# Patient Record
Sex: Female | Born: 1965 | Race: Black or African American | Hispanic: No | Smoking: Current some day smoker
Health system: Southern US, Community
[De-identification: ages and names within clinical notes are randomized; demographics above are authoritative.]

## PROBLEM LIST (undated history)

## (undated) DIAGNOSIS — T7840XA Allergy, unspecified, initial encounter: Secondary | ICD-10-CM

## (undated) DIAGNOSIS — D649 Anemia, unspecified: Secondary | ICD-10-CM

## (undated) DIAGNOSIS — B359 Dermatophytosis, unspecified: Secondary | ICD-10-CM

## (undated) DIAGNOSIS — A749 Chlamydial infection, unspecified: Secondary | ICD-10-CM

## (undated) DIAGNOSIS — L309 Dermatitis, unspecified: Secondary | ICD-10-CM

## (undated) DIAGNOSIS — B009 Herpesviral infection, unspecified: Secondary | ICD-10-CM

## (undated) HISTORY — PX: OTHER SURGICAL HISTORY: SHX169

## (undated) HISTORY — PX: BREAST REDUCTION SURGERY: SHX8

## (undated) HISTORY — PX: COLONOSCOPY: SHX174

## (undated) HISTORY — DX: Herpesviral infection, unspecified: B00.9

## (undated) HISTORY — DX: Dermatitis, unspecified: L30.9

## (undated) HISTORY — DX: Chlamydial infection, unspecified: A74.9

## (undated) HISTORY — PX: POLYPECTOMY: SHX149

## (undated) HISTORY — DX: Dermatophytosis, unspecified: B35.9

## (undated) HISTORY — DX: Allergy, unspecified, initial encounter: T78.40XA

## (undated) HISTORY — PX: WISDOM TOOTH EXTRACTION: SHX21

## (undated) HISTORY — DX: Anemia, unspecified: D64.9

## (undated) HISTORY — PX: BREAST SURGERY: SHX581

---

## 1999-02-08 ENCOUNTER — Other Ambulatory Visit: Admission: RE | Admit: 1999-02-08 | Discharge: 1999-02-08 | Payer: Self-pay | Admitting: Obstetrics and Gynecology

## 1999-02-11 ENCOUNTER — Ambulatory Visit (HOSPITAL_COMMUNITY): Admission: AD | Admit: 1999-02-11 | Discharge: 1999-02-11 | Payer: Self-pay | Admitting: Obstetrics and Gynecology

## 1999-07-16 ENCOUNTER — Other Ambulatory Visit: Admission: RE | Admit: 1999-07-16 | Discharge: 1999-07-16 | Payer: Self-pay | Admitting: Obstetrics and Gynecology

## 2000-02-09 ENCOUNTER — Inpatient Hospital Stay (HOSPITAL_COMMUNITY): Admission: AD | Admit: 2000-02-09 | Discharge: 2000-02-12 | Payer: Self-pay | Admitting: Obstetrics & Gynecology

## 2000-02-14 ENCOUNTER — Encounter: Admission: RE | Admit: 2000-02-14 | Discharge: 2000-03-18 | Payer: Self-pay | Admitting: Obstetrics and Gynecology

## 2000-02-16 ENCOUNTER — Inpatient Hospital Stay (HOSPITAL_COMMUNITY): Admission: AD | Admit: 2000-02-16 | Discharge: 2000-02-16 | Payer: Self-pay | Admitting: Obstetrics and Gynecology

## 2000-07-22 ENCOUNTER — Encounter: Admission: RE | Admit: 2000-07-22 | Discharge: 2000-08-20 | Payer: Self-pay | Admitting: Obstetrics and Gynecology

## 2001-04-27 ENCOUNTER — Encounter: Admission: RE | Admit: 2001-04-27 | Discharge: 2001-04-27 | Payer: Self-pay | Admitting: Obstetrics and Gynecology

## 2001-04-27 ENCOUNTER — Encounter: Payer: Self-pay | Admitting: Obstetrics and Gynecology

## 2001-05-04 ENCOUNTER — Encounter: Payer: Self-pay | Admitting: Emergency Medicine

## 2001-05-04 ENCOUNTER — Emergency Department (HOSPITAL_COMMUNITY): Admission: EM | Admit: 2001-05-04 | Discharge: 2001-05-04 | Payer: Self-pay | Admitting: Emergency Medicine

## 2001-12-30 ENCOUNTER — Other Ambulatory Visit: Admission: RE | Admit: 2001-12-30 | Discharge: 2001-12-30 | Payer: Self-pay | Admitting: Obstetrics and Gynecology

## 2002-05-17 ENCOUNTER — Ambulatory Visit (HOSPITAL_BASED_OUTPATIENT_CLINIC_OR_DEPARTMENT_OTHER): Admission: RE | Admit: 2002-05-17 | Discharge: 2002-05-18 | Payer: Self-pay | Admitting: Specialist

## 2002-05-17 ENCOUNTER — Encounter (INDEPENDENT_AMBULATORY_CARE_PROVIDER_SITE_OTHER): Payer: Self-pay | Admitting: Specialist

## 2003-01-05 ENCOUNTER — Other Ambulatory Visit: Admission: RE | Admit: 2003-01-05 | Discharge: 2003-01-05 | Payer: Self-pay | Admitting: Obstetrics and Gynecology

## 2004-01-17 ENCOUNTER — Other Ambulatory Visit: Admission: RE | Admit: 2004-01-17 | Discharge: 2004-01-17 | Payer: Self-pay | Admitting: Obstetrics and Gynecology

## 2004-03-06 ENCOUNTER — Ambulatory Visit (HOSPITAL_COMMUNITY): Admission: RE | Admit: 2004-03-06 | Discharge: 2004-03-06 | Payer: Self-pay | Admitting: Obstetrics and Gynecology

## 2004-03-15 ENCOUNTER — Ambulatory Visit (HOSPITAL_COMMUNITY): Admission: RE | Admit: 2004-03-15 | Discharge: 2004-03-15 | Payer: Self-pay | Admitting: Obstetrics and Gynecology

## 2004-06-20 ENCOUNTER — Inpatient Hospital Stay (HOSPITAL_COMMUNITY): Admission: AD | Admit: 2004-06-20 | Discharge: 2004-06-20 | Payer: Self-pay | Admitting: Obstetrics and Gynecology

## 2004-08-01 ENCOUNTER — Inpatient Hospital Stay (HOSPITAL_COMMUNITY): Admission: RE | Admit: 2004-08-01 | Discharge: 2004-08-04 | Payer: Self-pay | Admitting: Obstetrics and Gynecology

## 2004-08-05 ENCOUNTER — Encounter: Admission: RE | Admit: 2004-08-05 | Discharge: 2004-09-04 | Payer: Self-pay | Admitting: Obstetrics and Gynecology

## 2005-02-14 IMAGING — US US AMNIOCENTESIS
1 series · 2 of 2 positions shown · non-contrast
Comparison: none

[Series 1: unknown · 2 of 2 slices shown]
[im 1/2]
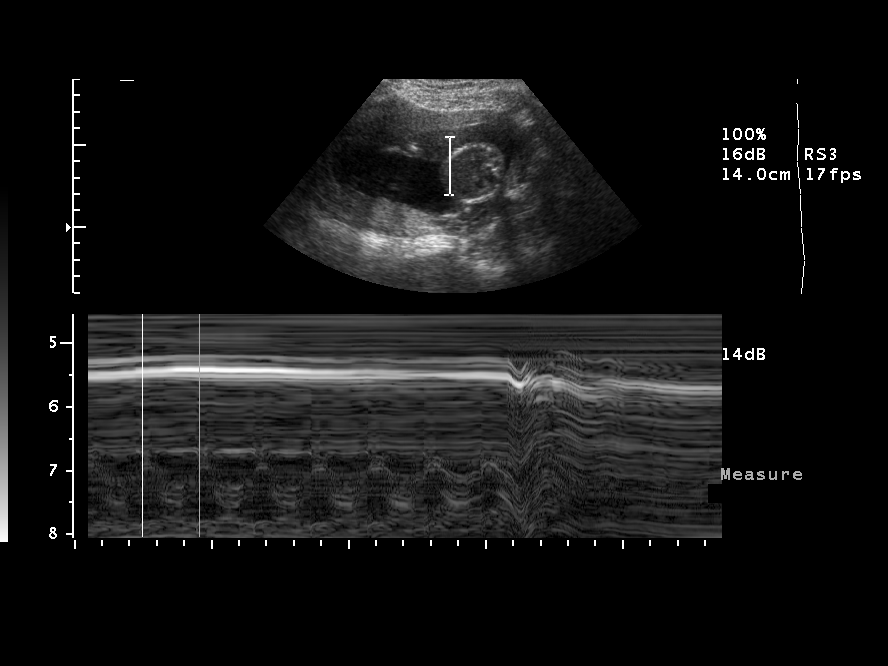
[im 2/2]
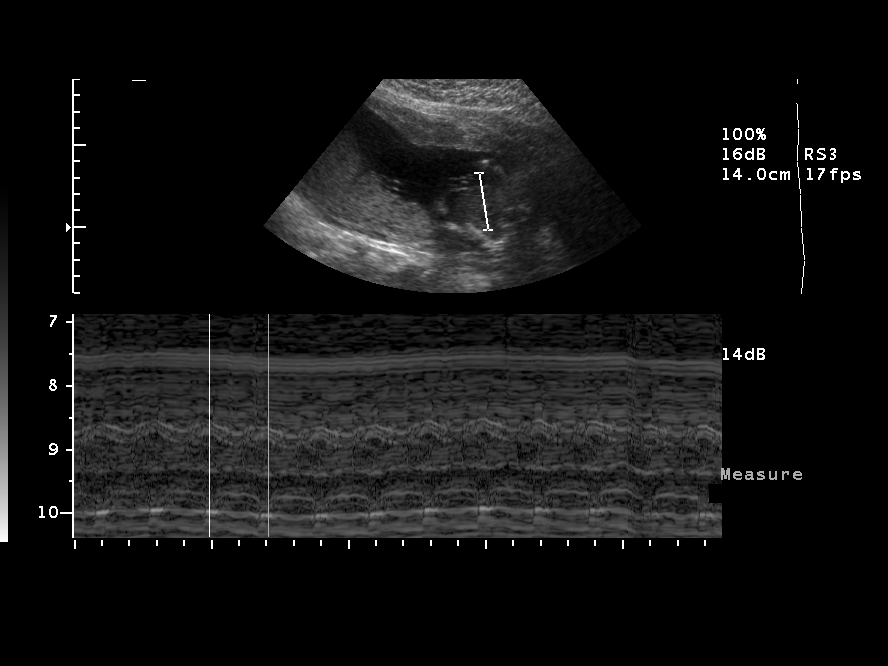

[2 of 2 positions shown; findings below may reference images not displayed]

ULTRASOUND AMNIOCENTESIS
 Ultrasound was utilized to perform amniocentesis by the requesting physician.

## 2005-02-23 IMAGING — US US OB DETAIL+14 WK
1 series · 13 of 28 positions shown · non-contrast
Comparison: none

CLINICAL DATA: Advanced maternal age.  Assess anatomy.

[Series 1: unknown · 0.27mm/px · 13 of 107 slices shown]
[im 4/107]
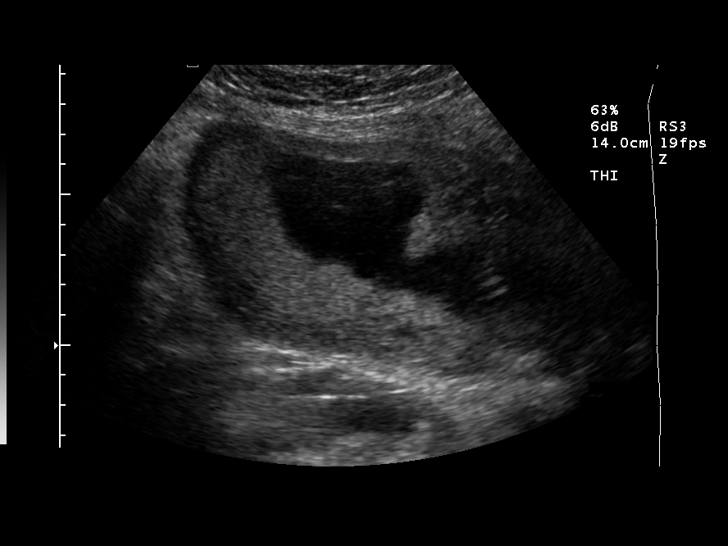
[im 12/107]
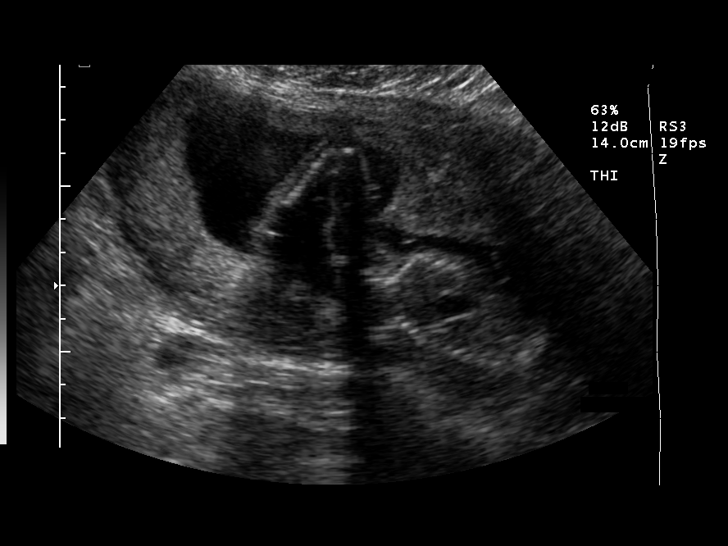
[im 20/107]
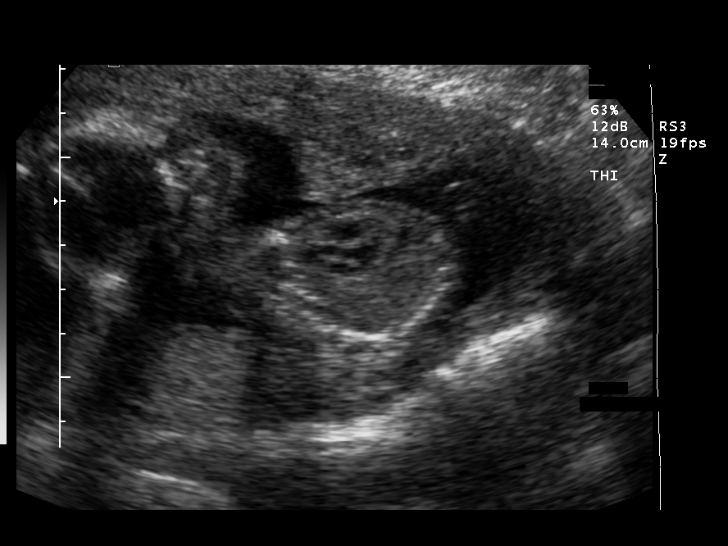
[im 28/107]
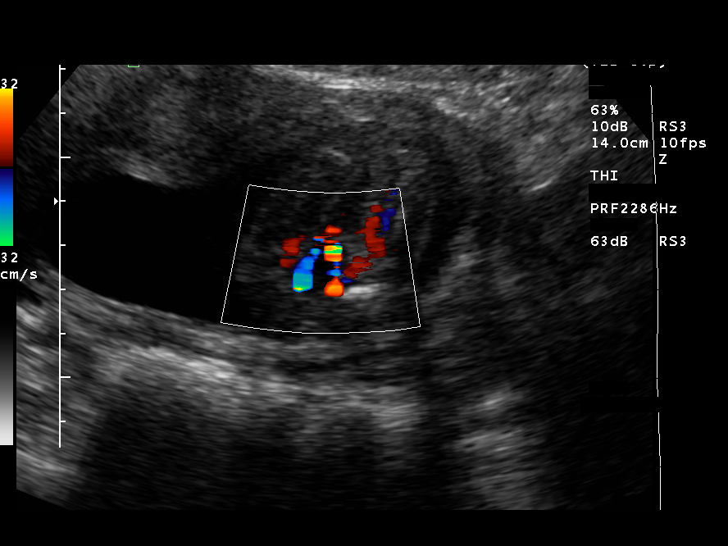
[im 36/107]
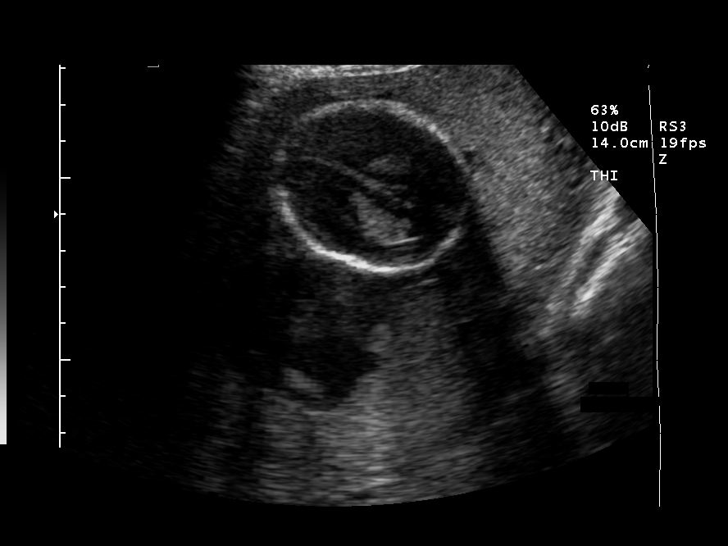
[im 44/107]
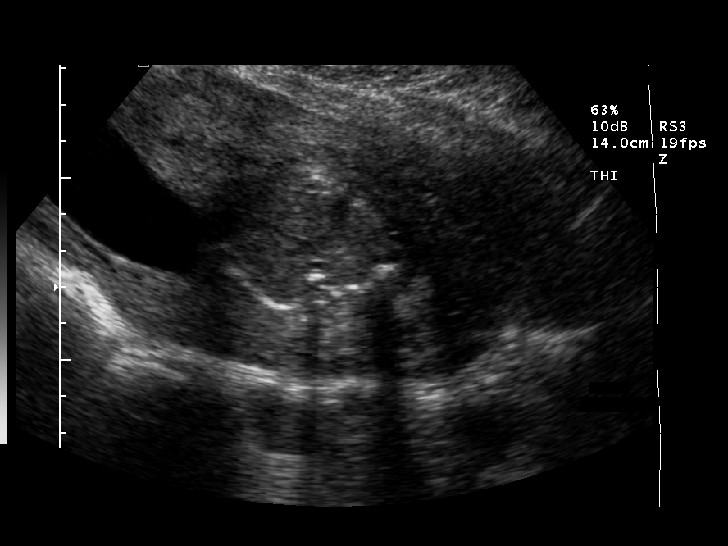
[im 55/107]
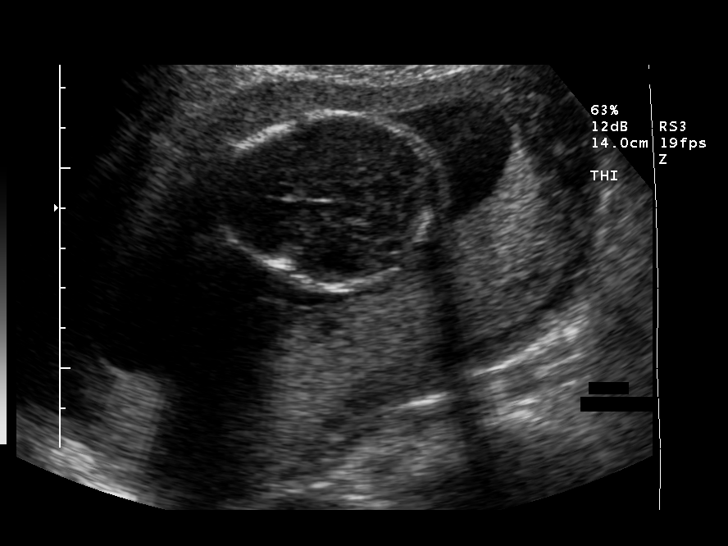
[im 63/107]
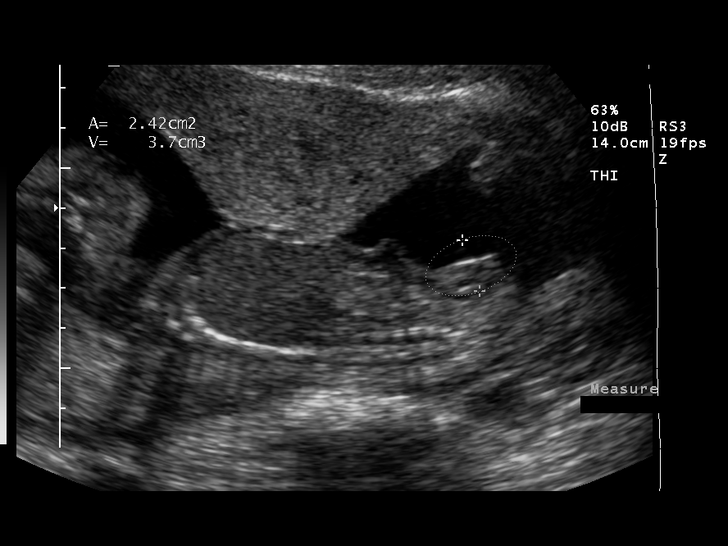
[im 71/107]
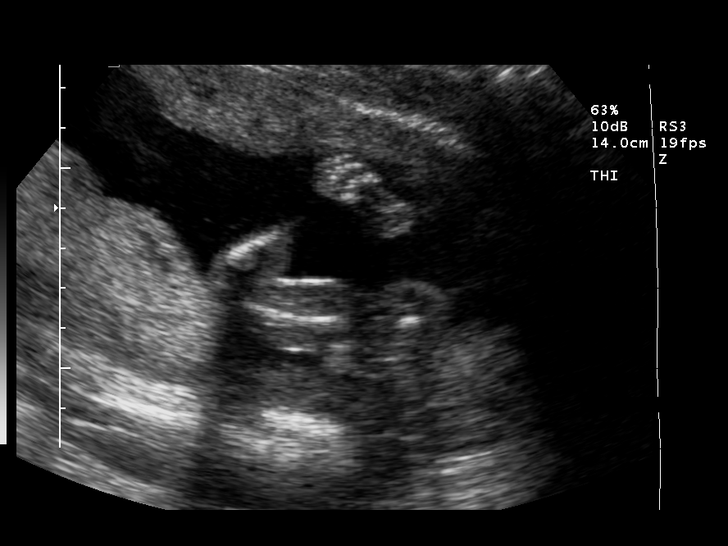
[im 79/107]
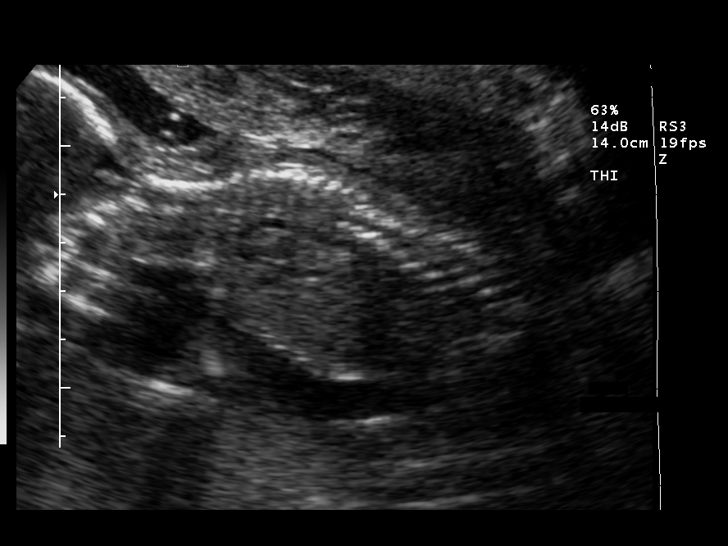
[im 87/107]
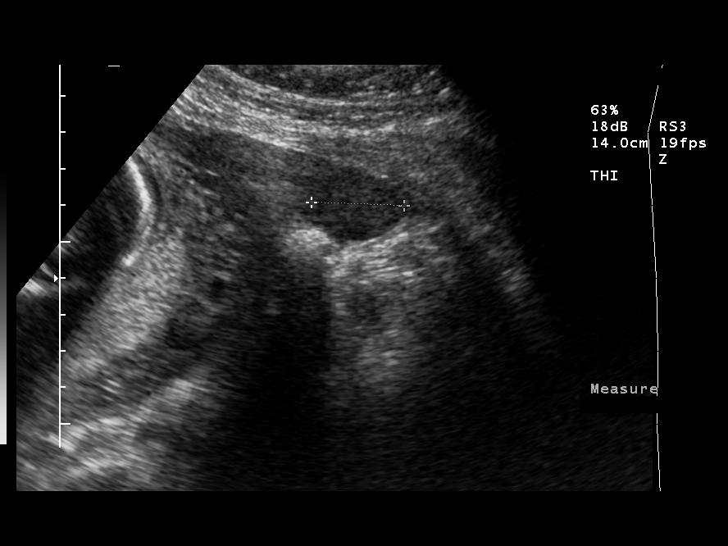
[im 95/107]
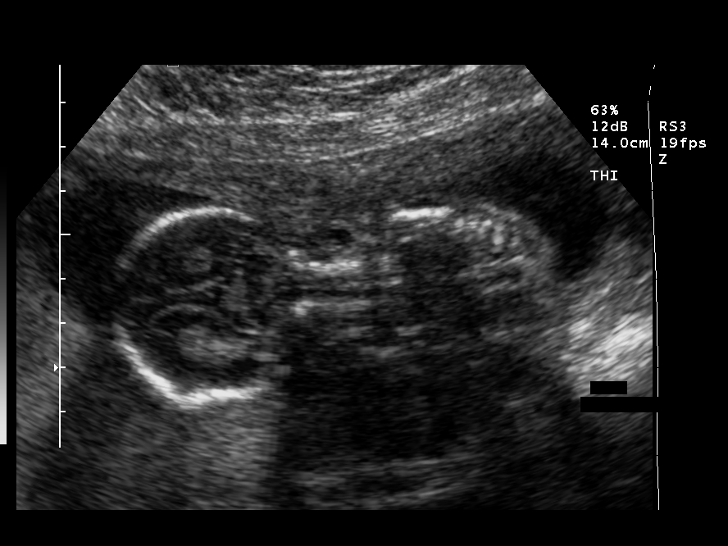
[im 103/107]
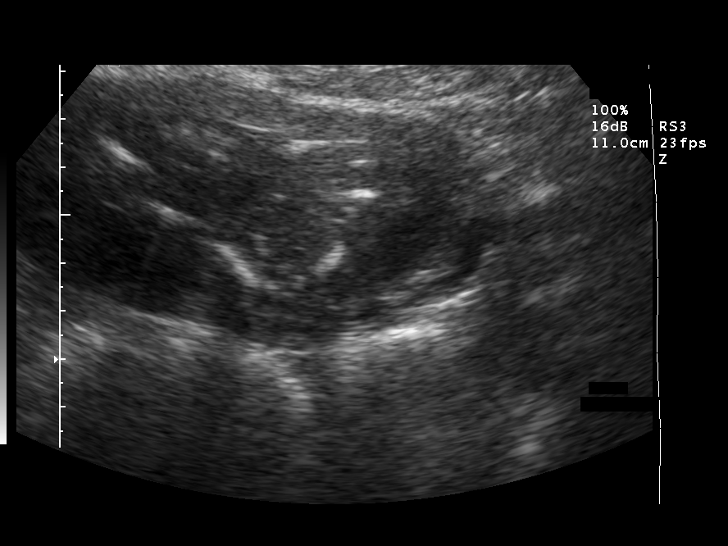

[13 of 28 positions shown; findings below may reference images not displayed]

DETAILED OBSTETRICAL ULTRASOUND 

 Number of Fetuses:  1
 Heart Rate:  150
 Movement:  Yes
 Breathing:  No
 Presentation:  Breech
 Placental Location:  Posterior
 Grade:  I
 Previa:  No
 Amniotic Fluid (Subjective):  Normal
 Amniotic Fluid (Objective):  4.8 cm Vertical pocket 

 FETAL BIOMETRY
 BPD:  4.3 cm   19 w 0 d
 HC:  16.1 cm   19 w 0 d
 AC:  13.1 cm   18 w 5 d
 FL:   2.9 cm   19 w 1 d
 HL:   2.8 cm   19 w 1 d

 MEAN GA:  19 w 0 d

 FETAL ANATOMY
 Lateral Ventricles:  Visualized 
 Thalami/CSP:  Visualized 
 Posterior Fossa:  Visualized 
 Nuchal Region:  Visualized 
 Spine:  Visualized 
 4 Chamber Heart on Left:  Visualized 
 Stomach on Left:  Visualized 
 3 Vessel Cord:  Visualized 
 Cord Insertion Site:  Visualized 
 Kidneys:  Visualized 
 Bladder:  Visualized 
 Extremities:  Visualized 

 ADDITIONAL ANATOMY VISUALIZED:  LVOT, RVOT, upper lip, orbits, profile, diaphragm, heel, 5th digit, ductal arch, aortic arch, female genitalia and nasal bone.

 MATERNAL FINDINGS
 Cervix:  4.0 cm Transabdominally
IMPRESSION: Single living intrauterine fetus in breech presentation with subjectively normal amniotic fluid volume.  Mean gestational age by fetal biometry today is 19 weeks 0 days which correlates well with the stated gestational age by LMP.  
 The visualized fetal anatomy is unremarkable.  

 </u12:p>

## 2005-05-31 IMAGING — US US FETAL BPP W/O NONSTRESS
1 series · 18 of 22 positions shown · non-contrast
Comparison: none

CLINICAL DATA: 37-year-old who is approximately 33 weeks gestational age. Decelerations in the
office earlier today.

[Series 1: us fetal bpp w/o nonstress · 18 of 22 slices shown]
[im 1/22]
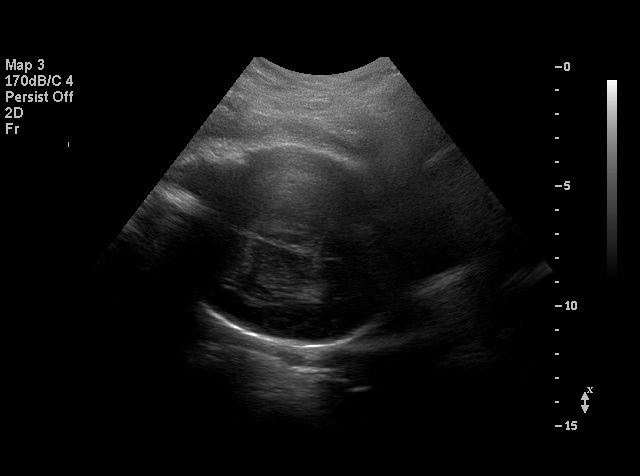
[im 2/22]
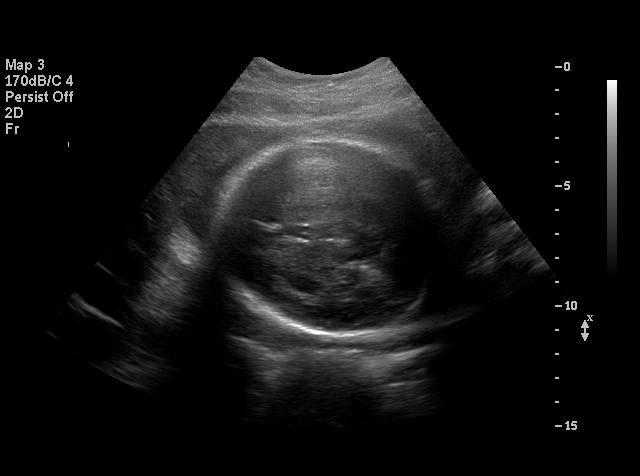
[im 4/22]
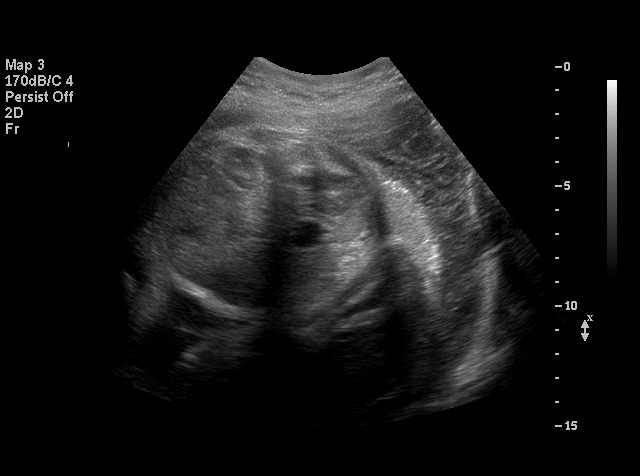
[im 5/22]
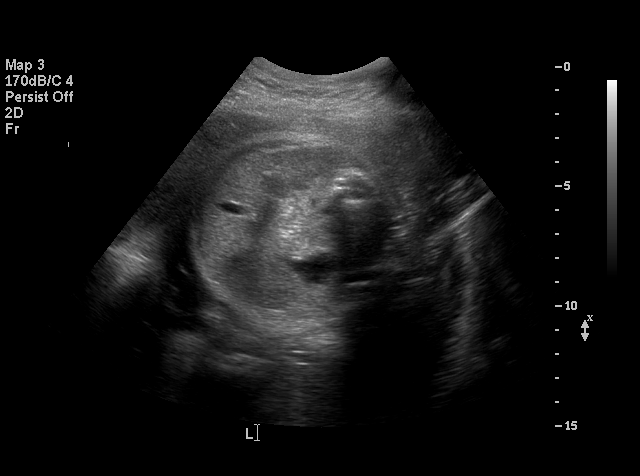
[im 6/22]
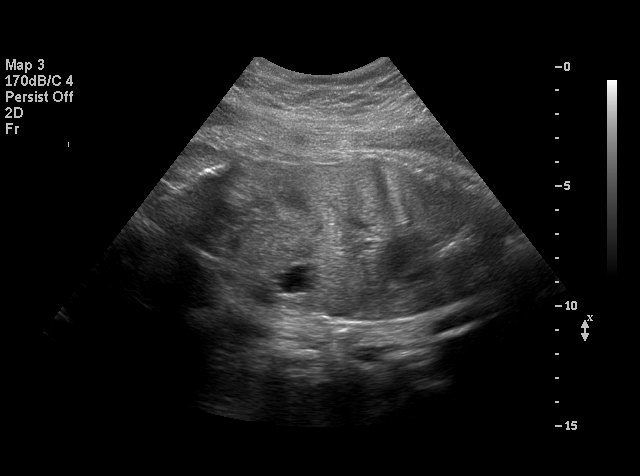
[im 7/22]
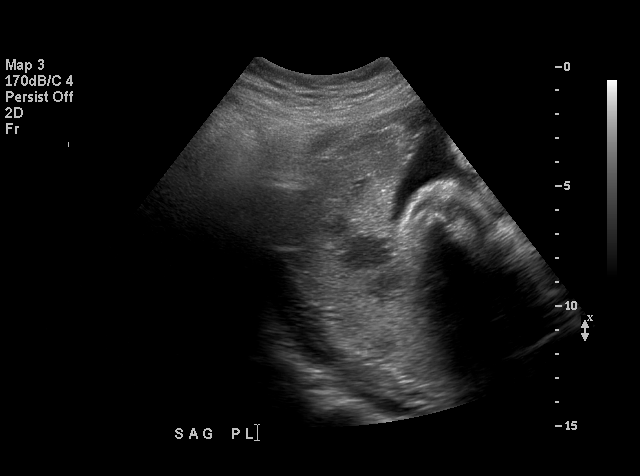
[im 8/22]
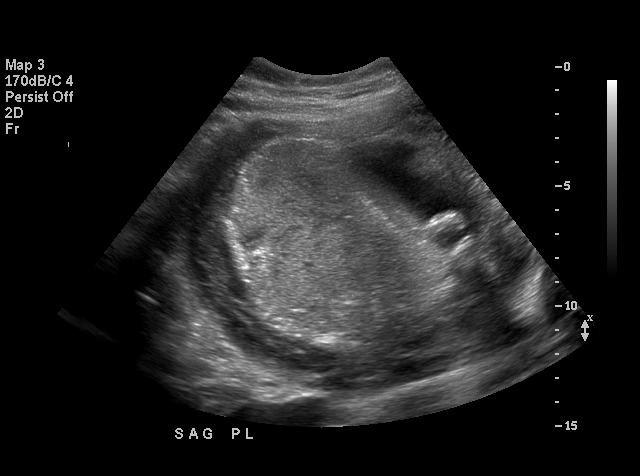
[im 10/22]
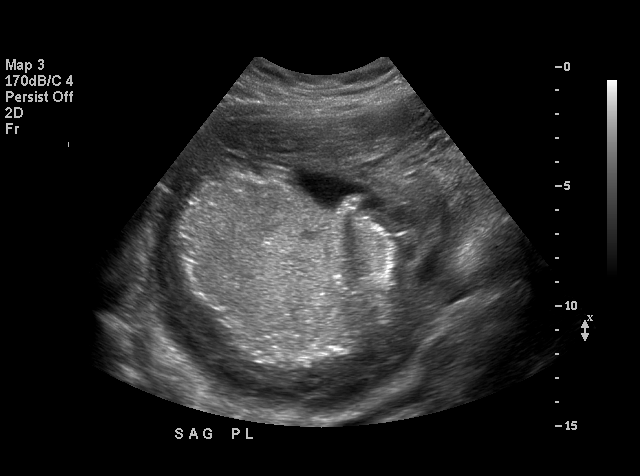
[im 11/22]
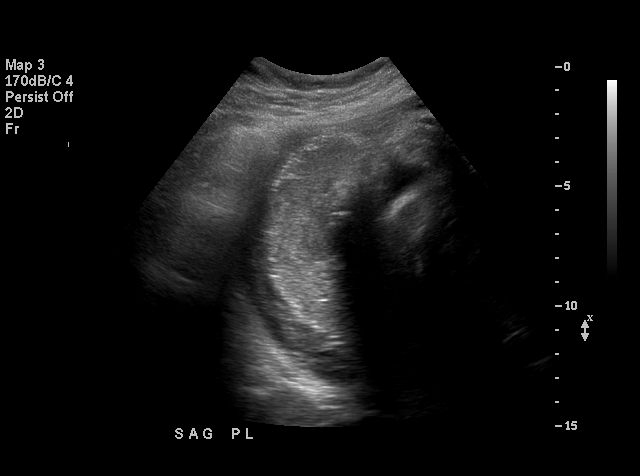
[im 12/22]
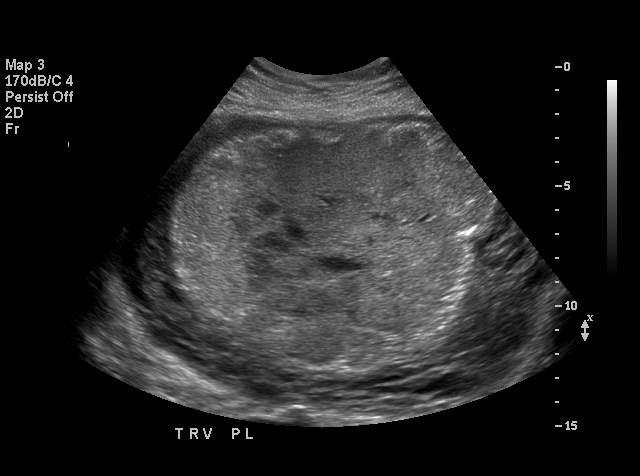
[im 13/22]
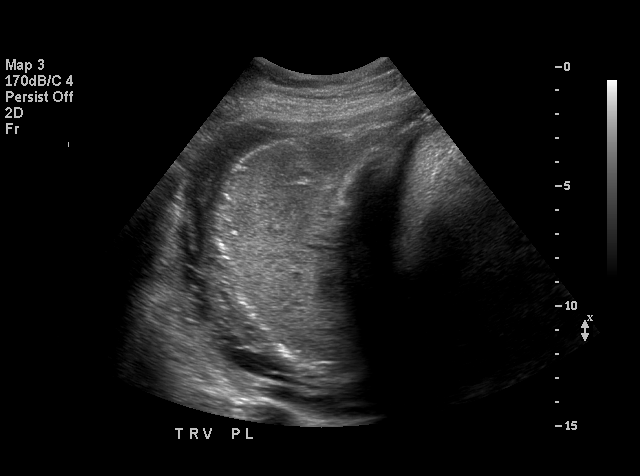
[im 15/22]
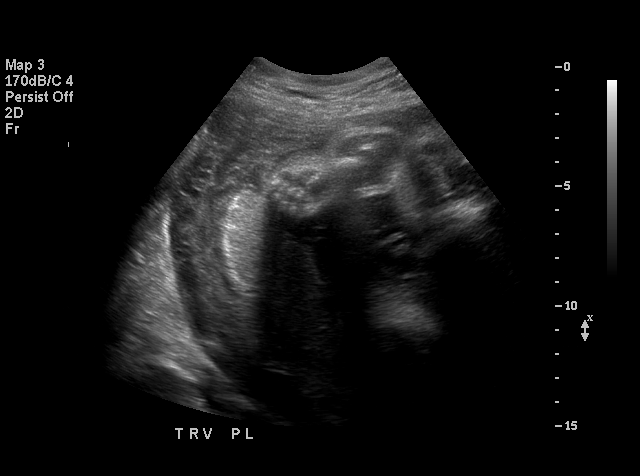
[im 16/22]
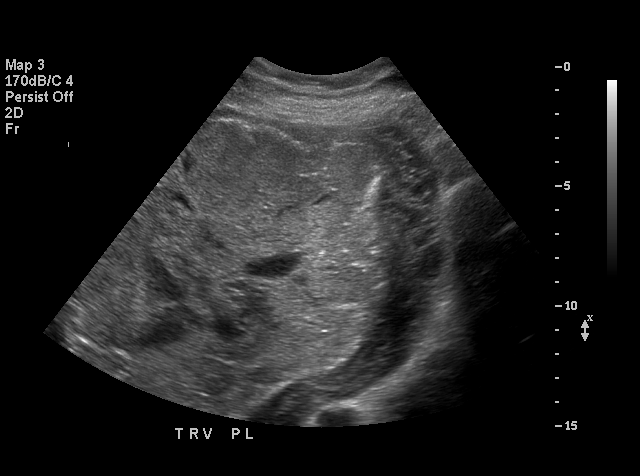
[im 17/22]
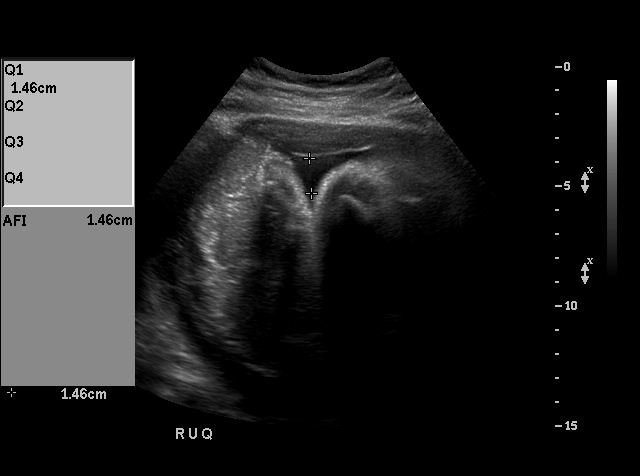
[im 18/22]
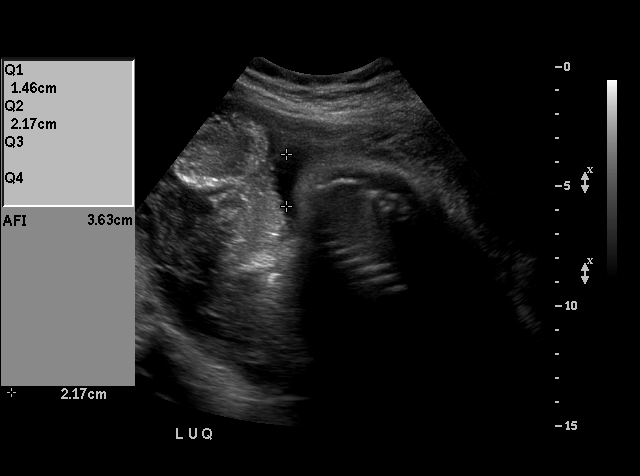
[im 19/22]
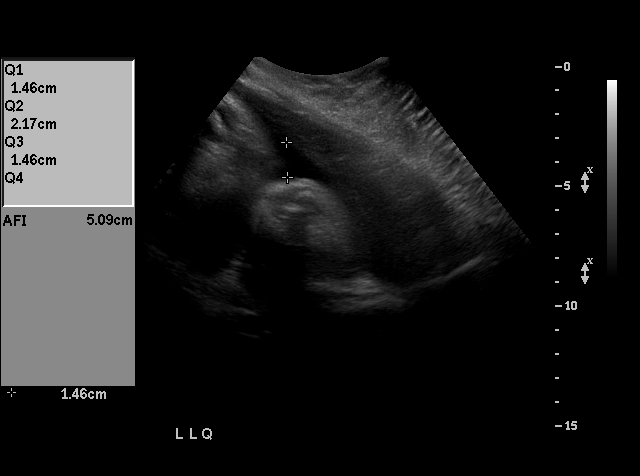
[im 21/22]
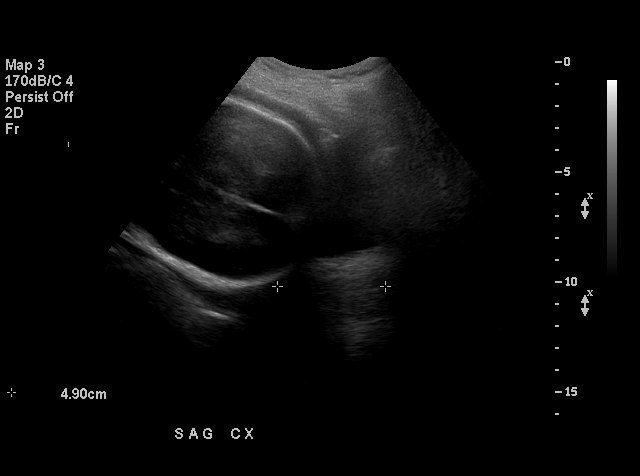
[im 22/22]
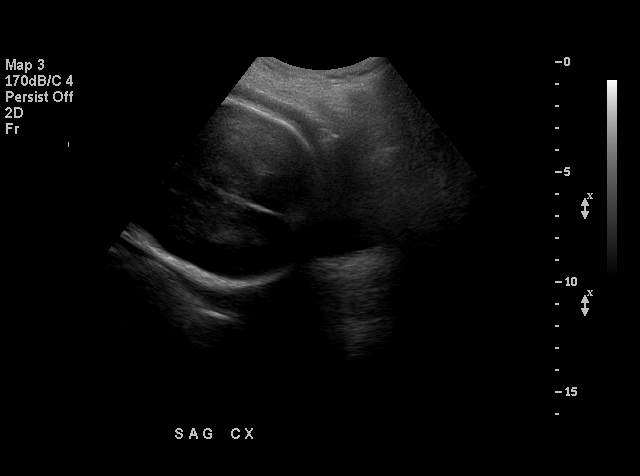

[18 of 22 positions shown; findings below may reference images not displayed]

BIOPHYSICAL PROFILE 

Number of fetuses:  1
Heart rate:  143
Movement:  Yes
Breathing:  Yes
Placenta Location:  Posterior.
Grade:  2
Previa:  No
Amniotic Fluid (Subjective):  Normal
Amniotic Fluid (Objective):  9.3 cm AFI  (5 - 95th %ile = 8.3 - 24.5 cm for 33 wks)

Fetal measurements and anatomic evaluation were not requested.  The following fetal anatomy was
visualized on this exam:  Left-sided stomach, both kidneys, bladder.

BPP SCORING  -  Time:  15 min.
Movement:  2
Tone:          2
Breathing:  2
Amniotic Fluid:  2
Total Score:  8

MATERNAL FINDINGS
Cervix:  4.9 cm;  transabdominal.

IMPRESSION

1. Single living intrauterine fetus.

2. Cephalic presentation.

3. Normal amniotic fluid.

4. Biophysical profile score [DATE] of a possible 8 in 15 minutes.

## 2005-09-05 ENCOUNTER — Other Ambulatory Visit: Admission: RE | Admit: 2005-09-05 | Discharge: 2005-09-05 | Payer: Self-pay | Admitting: Obstetrics and Gynecology

## 2007-07-03 ENCOUNTER — Encounter: Admission: RE | Admit: 2007-07-03 | Discharge: 2007-07-03 | Payer: Self-pay | Admitting: Obstetrics and Gynecology

## 2008-01-13 ENCOUNTER — Encounter: Admission: RE | Admit: 2008-01-13 | Discharge: 2008-01-13 | Payer: Self-pay | Admitting: Obstetrics and Gynecology

## 2008-07-12 ENCOUNTER — Encounter: Admission: RE | Admit: 2008-07-12 | Discharge: 2008-07-12 | Payer: Self-pay | Admitting: Obstetrics and Gynecology

## 2009-07-13 ENCOUNTER — Encounter: Admission: RE | Admit: 2009-07-13 | Discharge: 2009-07-13 | Payer: Self-pay | Admitting: Obstetrics and Gynecology

## 2010-08-02 ENCOUNTER — Encounter: Admission: RE | Admit: 2010-08-02 | Discharge: 2010-08-02 | Payer: Self-pay | Admitting: Obstetrics and Gynecology

## 2011-02-08 NOTE — Op Note (Signed)
NAME:  Candice [name], Candice [name]                   ACCOUNT NO.:  [id number]   MEDICAL RECORD NO.:  [id number]                   PATIENT TYPE:  OUT   LOCATION:  ULT                                  [facility]ACILITY:  [facility]   PHYSICIAN:  Janine Limbo, M.D.            DATE OF BIRTH:  1966-06-03   DATE OF PROCEDURE:  03/06/2004  DATE OF DISCHARGE:                                 OPERATIVE REPORT   PREOPERATIVE DIAGNOSES:  1. An 18-week gestation.  2. Age greater than 35.   POSTOPERATIVE DIAGNOSES:  1. An 18-week gestation.  2. Age greater than 35.   PROCEDURE:  Genetic amniocentesis.   SURGEON:  Janine Limbo, M.D.   ANESTHESIA:  Local Xylocaine.   INDICATIONS FOR PROCEDURE:  [name] is a 45 year old female  who presents at [redacted] weeks gestation.  She understands the indications for her  genetic amniocentesis.  She accepts the risks of intrauterine bleeding,  rupture of membranes, and possible miscarriage (1/200).   FINDINGS:  The patient is Rh positive.  She was found to have a single  intrauterine gestation with normal fluid.  The placenta was posterior.   DESCRIPTION OF PROCEDURE:  The patient was seen in the ultrasound suite.  An  ultrasound was performed.  An appropriate fluid pocket was isolated.  The  patient's abdomen was prepped with multiple layers of Betadine and then  sterilely draped.  There were 2 mL of 1% Xylocaine injected into the skin.  The spinal needle was inserted into the amniotic cavity under ultrasound  guidance.  There were 18 mL of clear amniotic fluid removed.  The patient  tolerated the procedure well.  The needle was removed and the ultrasound was  repeated.  Amniotic fluid volume was noted to be appropriate.  Fetal heart  motions were noted to be appropriate.  There was no bleeding.  The patient  tolerated the procedure well.   FOLLOW UP INSTRUCTIONS:  The amniotic fluid will be sent for chromosomal  analysis and for  acetylcholinesterase.  The patient was discharged to home  with instructions.  She will call for questions or concerns.                                               Janine Limbo, M.D.    AVS/MEDQ  D:  03/06/2004  T:  03/06/2004  Job:  [id number]

## 2011-02-08 NOTE — H&P (Signed)
[facility]  Patient:    Candice [name], Candice [name]                           MRN: [id number] Adm. Date:  [id number] Attending:  Leonard Schwartz                         History and Physical  HISTORY OF PRESENT ILLNESS:   The patient is a 45 year old female, gravida 3, para 0-0-2-0, who presents at [redacted] weeks gestation (EDC is Feb 15, 2000) for cesarean delivery.  The patient has been followed at [facility] and Gynecology for this pregnancy that has been largely uncomplicated.  The patient did have an ultrasound performed that showed a breech infant.  She declined an attempted external version because of the potential complications.  OBSTETRICAL HISTORY:          The patient has had one spontaneous abortion and one elective abortion.  PAST MEDICAL HISTORY:         The patient had her wisdom teeth removed at age 47.  She had an elective pregnancy termination in 1991.  She had a D&C associated with a miscarriage.  She had a kidney infection at age 72 and was treated with IV antibiotics.  DRUG ALLERGIES:               None known.  SOCIAL HISTORY:               The patient denies cigarette use, alcohol use and recreational drug use.  REVIEW OF SYSTEMS:            Normal pregnancy complaints.  FAMILY HISTORY:               The patients brother has hypertension.  The patients mother is a recovered alcohol abuser.  PHYSICAL EXAMINATION:  VITAL SIGNS:                  Weight is 196 pounds.  HEENT:                        Within normal limits.  CHEST:                        Clear.  HEART:                        Regular rate and rhythm.  BREASTS:                      Without masses.  ABDOMEN:                      Her abdomen is gravid with a fundal height of 37 cm.  EXTREMITIES:                  Within normal limits.  NEUROLOGICAL:                 Exam is normal.  PELVIC:                       Cervix is fingertip, 50%, and the  presenting part is not in the pelvis.  LABORATORY VALUES:            Blood type is O positive.  Antibody screen negative.  VDRL  nonreactive.  Sickle cell negative.  Rubella-immune.  HBSAG negative.  GC negative.  Chlamydia negative.  Pap within normal limits. Glucola screen is 76.  Alpha-fetoprotein within normal limits.  Third trimester beta strep is negative.  ASSESSMENT:                   1. A 29-week gestation.                               2. Breech presentation.  PLAN:                         The patient will undergo a primary cesarean delivery.  She understands the indications for her procedure, and she accepts the risks of, but not limited to, anesthetic complications, bleeding, infections, and possible damage to the surrounding organs. DD:  02/08/00 TD:  02/09/00 Job: [id number]

## 2011-02-08 NOTE — H&P (Signed)
[facility]  Patient:    Candice [name], Candice [name]                           MRN: [id number] Adm. Date:  [id number] Attending:  Leonard Schwartz Dictator:   Erin Sons, C.N.M.                         History and Physical  HISTORY OF PRESENT ILLNESS:   Ms. Candice [name] is a 45 year old, gravida 3, para 0-0-2-0 at 39-1/7 weeks, who presents with uterine contractions and questionable leaking for the last two hours.  The patient is scheduled for cesarean section at 10 a.m. today secondary to breech presentation.  Pregnancy has remarkable for:                               1. Breech, which was diagnosed at 39 weeks.                               2. Two ABs.                               3. Questionable dates.  PRENATAL LABORATORY DATA:     Blood type is O positive.  Rh antibody negative. VDRL nonreactive.  Rubella titer positive.  Hepatitis B surface antigen negative.  Sickle cell test negative.  GC and Chlamydia cultures were negative.  Pap was normal.  Glucose challenge was normal.  AFP was normal. Hemoglobin upon entry into practice was 12.8.  It was 11.7 at 26 weeks. Group B strep culture was negative at 36 weeks.  EDC of Feb 15, 2000, was established by ultrasound at 11 weeks.  HISTORY OF PRESENT PREGNANCY:                    The patient entered care at approximately nine weeks.  Her EDC was uncertain secondary to a questionable LMP.  She was treated for BV at October 23.  She had an ultrasound at 11 weeks for dating and fetal heart rate auscultation.  She had some swelling in the second trimester.  She also has a upper respiratory infection in second trimester which was treated with amoxicillin.  The rest of her pregnancy was essentially uncomplicated.  She did have a fasting blood sugar and two-hour postprandial at 33 weeks secondary to an elevated dextrose stick on an office visit after drinking a sugar drink that was within normal limits.  She was seen in  the office earlier this week with questionable presentation.  An ultrasound was done the following day which documented a frank breech.  The patient was originally scheduled for C-section next Wednesday, but did request this be done earlier secondary to her fear of going into labor.  Dr. Stefano Gaul was able to schedule it for today at 10 a.m.  OBSTETRICAL HISTORY:          In 1991 she has a therapeutic termination of pregnancy at four weeks.  In May of 2000 she had a spontaneous miscarriage at 8-10 weeks.  PAST MEDICAL HISTORY:         The patient has on Ortho-Novum five years ago. She is also a previous condom user.  She was treated for Chlamydia at age 72.  She has occasional yeast infections.  She does have a history of PID. The patient had a history of anemia as a teenager.  She had a UTI and a kidney infection at age 11.  PAST SURGICAL HISTORY:        She had her wisdom teeth removed at age 63 or 26 and a termination of pregnancy in 34.  ALLERGIES:                    She has no known medication allergies.  FAMILY HISTORY:               The patients mother had a miscarriage.  Her brother is hypertensive, on medication.  Her maternal aunt has tuberculosis. Her maternal uncle has had prostate and lung cancer.  Her brother has a history of depression, is on medication.  Her mother is a recovering alcoholic.  Her maternal uncles are also alcoholics.  GENETIC HISTORY:              Unremarkable.  SOCIAL HISTORY:               The patient is married.  The father of the baby is supportive.  The patient is African-American.  She has two years of college and is employed as an Advertising account planner.  She has been followed by the [facility].  She denies any alcohol, drug, or tobacco use during this pregnancy.  The rest of the history and physical will be dictated at a separate dictation. D:  02/09/00 TD:  02/09/00 Job: [id number]

## 2011-02-08 NOTE — Discharge Summary (Signed)
NAMEDENVER, HARDER         ACCOUNT NO.:  [id number]   MEDICAL RECORD NO.:  [id number]          PATIENT TYPE:  INP   LOCATION:  9117                          FACILITY:  [facility]   PHYSICIAN:  Janine Limbo, M.D.DATE OF BIRTH:  [birth year]   DATE OF ADMISSION:  08/01/2004  DATE OF DISCHARGE:  08/04/2004                                 DISCHARGE SUMMARY   ADMISSION DIAGNOSES:  1.  Intrauterine pregnancy at 38 weeks.  2.  Prior cesarean section, desires repeat cesarean section.  3.  Pelvic adhesions.   PROCEDURE:  1.  Repeat low transverse cesarean section.  2.  Repair of pelvic adhesions.   Ms. Venturini is a 45 year old gravida 4 para 1-0-2-1 who presents at  [redacted] weeks gestation with [facility] August 13, 2004 for repeat cesarean delivery.  This was performed on August 01, 2004 by Dr. Marline Backbone with the birth  of a 6-pound 13-ounce female infant named Ladona Ridgel with Apgars scores of 7 at  one minute and 8 at five minutes.  The patient had thick adhesions on the  anterior uterine wall and these were repaired.  The patient did well in the  postoperative period.  Her hemoglobin on postoperative day #1 was 8.0.  Her  vital signs remained stable and she did not have any signs or symptoms of  syncope or symptoms related to her anemia.  Her orthostatic vital signs were  stable and she has done quite well in her hospital stay.  Her incision was  clean and intact.  She had a JP drain and it was removed on postoperative  day #3.  She declined blood transfusion, and in light of the fact that her  vital signs were stable and she was doing well, she was discharged home on  postoperative day #3.   DISCHARGE INSTRUCTIONS:  Per [facility] handout.   DISCHARGE MEDICATIONS:  1.  Motrin 600 mg p.o. q.6h. p.r.n. pain.  2.  Tylox one to two p.o. q.3-4h. pain.  3.  Micronor one daily as directed starting after 2 weeks postpartum.  4.  Prenatal vitamins.  5.  Iron supplement.   The patient is to return to the office of [facility] for postpartum exam at 6  weeks.     Pecolia Ades   SDM/MEDQ  D:  08/26/2004  T:  08/27/2004  Job:  [id number]

## 2011-02-08 NOTE — Op Note (Signed)
Candice [name], Candice [name]         ACCOUNT NO.:  [id number]   MEDICAL RECORD NO.:  [id number]          PATIENT TYPE:  INP   LOCATION:  9117                          [facility]ACILITY:  [facility]   PHYSICIAN:  Janine Limbo, M.D.DATE OF BIRTH:  01/23/1966   DATE OF PROCEDURE:  DATE OF DISCHARGE:                                 OPERATIVE REPORT   PREOPERATIVE DIAGNOSES:  1.  Term intrauterine pregnancy.  2.  Prior cesarean section.  3.  Desire repeat cesarean section.   POSTOPERATIVE DIAGNOSES:  1.  Term intrauterine pregnancy.  2.  Prior cesarean section.  3.  Desire repeat cesarean section.  4.  Dense pelvic adhesions.   PROCEDURE:  1.  Repeat low transverse cesarean section.  2.  Lysis of adhesions.   SURGEON:  Janine Limbo, M.D.   FIRST ASSISTANT:  Rica Koyanagi, C.N.M.   ANESTHESIA:  Spinal.   DISPOSITION:  Candice [name] is a 45 year old female, gravida 4, para 1-  0-2-1 who presents at term.  She has had a prior cesarean section and she  desires a repeat cesarean section.  The patient understands the indications  for her procedure and she accepts the risk of, but not limited to,  anesthetic complications, bleeding, infections, and possible damage to the  surrounding organs.   FINDINGS:  A 6 pound 13 ounce female infant Candice [name]) was delivered from a  cephalic presentation. There was one nuchal cord present. The Apgar scores  were 7 at 1 minute and 8 at 5 minutes.  There were dense adhesions present  in the patient's pelvis.  There was a thick band of adhesions between the  mid and upper uterus in the midline to the anterior peritoneum. The  fallopian tubes and the ovaries appeared normal for the gravid state.   DESCRIPTION OF PROCEDURE:  The patient was taken to the operating room where  a spinal anesthetic was given. A second spinal anesthetic was given for  adequate pain relief. The patient's abdomen and perineum were prepped with  multiple layers of  Betadine. A Foley catheter was placed in the bladder. The  patient was sterilely draped.  The lower abdomen was injected with 10 mL of  0.5% Marcaine with epinephrine.  A low transverse incision was made and the  incision was carried sharply through the subcutaneous tissue, the fascia and  the anterior peritoneum. Dense scarring was present in each of the layers.  Because the patient was still uncomfortable, she was given Marcaine and  Nesacaine in the different layers of the abdominal wall.  The dense  adhesions between the anterior uterus and the anterior peritoneum was then  encountered.  The adhesion was cut using a combination of blunt dissection,  and [name] dissection requiring multiple sutures.  The bladder flap was then  developed. An incision was made in the lower uterine segment and extended  transversely.  The fetal head was delivered without difficulties. A Nuchal  cord was encountered.  The mouth and nose were suctioned. The remainder of  the infant was delivered and the infant was then handed to the awaiting  pediatric team.  Routine cord  blood studies were obtained. The placenta was  removed.  The uterine cavity was cleaned of amniotic fluid, clotted blood,  and membranes.  The low transverse incision was closed using a running  locking suture of 2-0 Vicryl followed by an imbricating suture of 2-0  Vicryl. There was bleeding from the adhesions that we encountered earlier  and hemostasis was made adequate using interrupted and running sutures of #0  Vicryl.  Once hemostasis was adequate, the pelvis was vigorously irrigated.  The anterior peritoneum was inspected and we made sure that hemostasis was  adequate. Intercede was placed on the anterior surface of the uterus in  hopes that we would minimize the scar tissue that forms after this  procedure.  The anterior peritoneum and the abdominal musculature were  reapproximated in the midline using a running suture of #0 Vicryl.  The  subcutaneous layer and the fascia were then irrigated. Hemostasis was  adequate. The fascia was closed using a running suture of #0 Vicryl followed  by three interrupted sutures of #0 Vicryl.  A Jackson-Pratt drain was placed  in the subcutaneous layer and brought out through the left lower quadrant.  It was sutured into place using 3-0 silk. The subcutaneous layer was closed  using a running suture of 2-0 Vicryl.  The skin was reapproximated using a  subcuticular suture of 4-0 Vicryl.  Sponge, needle and instrument counts  were correct on two occasions. The estimated blood loss for the procedure  was 1200 mL.  The patient tolerated the procedure well. She was taken to the  recovery room in stable condition.  The infant was taken to the fullterm  nursery in stable condition.  Operative time was two hours.     Arth   AVS/MEDQ  D:  08/01/2004  T:  08/01/2004  Job:  [id number]   cc:   Candice [name], M.D. [facility]

## 2011-02-08 NOTE — Discharge Summary (Signed)
[facility]  Patient:    Candice [name], Candice [name]                           MRN: [id number] Adm. Date:  [id number] Disc. Date: 02/12/00 Attending:  Leonard Schwartz Dictator:   Erin Sons, C.N.M.                           Discharge Summary  ADMISSION DIAGNOSES:          1. Intrauterine pregnancy at 39-1/7 weeks.                               2. Breech presentation.                               3. Active labor.  DISCHARGE DIAGNOSES:          1. Footling breech.                               2. Cesarean section.  PROCEDURE:                    Primary low transverse cesarean section.  Spinal anesthesia.  HOSPITAL COURSE:              Ms. Candice [name] is a 45 year old, gravida 3, para 0-0-2-0, at 39-1/7 weeks who presented with uterine contractions and questionable leaking early in the morning of Feb 09, 2000.  Her history had been remarkable for: 1) Breech presentation which was diagnosed at 39 weeks.  2) Two ABs. 3) Questionable dates.  4) History of pyelonephritis.  5) History of pelvic inflammatory disease.  The patient had been scheduled for cesarean section at 10 a.m. on Feb 09, 2000.  The decision was made to proceed with cesarean section since the patient was 4 to 5 cm, 80%, breech presentation, -1 presentation, and there was rupture of membranes noted with light meconium stained fluid.  The patient was taken to the operating room where a primary low transverse cesarean section was performed under spinal anesthesia by Janine Limbo, M.D. Findings were a viable female by the name of Candice [name], weight 6 pounds 8 ounces, Apgars were 8 and 9 in a footling breech presentation.  Uterus, tubes, and ovaries were normal. Estimated blood loss was 600 cc.  The patient was taken to the recovery room in  good condition.  The infant was taken to the fullterm nursery in good condition. The infant was DeLeed of the aftercoming head secondary to the meconium.   On postoperative day #1, the patient was doing well.  She had had a temperature maximum of 100.4.  Her WBC count was 13.8, platelets were 117, hemoglobin was 11.7. Her physical examination was within normal limits.  Her incision was clean, dry, and intact.  Her husband was electing to have a vasectomy later.  The patient wanted to breastfeed.  The Foley catheter was maintained over the first night secondary to an unsuccessful attempt after removal last night.  By postoperative day #2, the patient was doing well.  She was voiding without difficulty.  Her WBC count was 17.6, although she was afebrile.  Physical examination was within normal limits.  By postoperative day #3, CBC was checked  again with a WBC count of 15.4 which was lower and a platelet count of 148.  Hemoglobin was 11.6.  Her physical examination was within normal limits.  Her incision was clean, dry, and intact.  She was deemed to have received the full benefit of her hospital stay and was discharged home.  DISCHARGE INSTRUCTIONS:       Per [facility] handout.  DISCHARGE MEDICATIONS:        1. Motrin 600 mg p.o. q.6h. p.r.n. pain.                               2. Tylox one to two p.o. q.3-4h. p.r.n. pain.                               3. Prenatal vitamins one p.o. q.d.  FOLLOW-UP:                    Discharge follow-up will occur in six weeks at [facility] and Gynecology. DD:  02/12/00 TD:  02/12/00 Job: [id number]

## 2011-02-08 NOTE — Op Note (Signed)
NAME:  Candice [name], Candice [name]                   ACCOUNT NO.:  [id number]   MEDICAL RECORD NO.:  [id number]                   PATIENT TYPE:  AMB   LOCATION:  DSC                                  FACILITY:  [facility]   PHYSICIAN:  Earvin Hansen L. Shon Hough, M.D.           DATE OF BIRTH:  December 03, 1965   DATE OF PROCEDURE:  05/17/2002  DATE OF DISCHARGE:                                 OPERATIVE REPORT   INDICATIONS FOR PROCEDURE:  This 45 year old lady has a history of increased  back and shoulder pain secondary to large pendulous breasts, increased  intertrigo, and pitting at both shoulders.  Conservative treatment has been  used to no avail.   PROCEDURE PLANNED:  Bilateral breast reductions using the inferior pedicle  technique.   SURGEON:  Yaakov Guthrie. Shon Hough, M.D.   ASSISTANT:  Alethia Berthold, first assistant.   DESCRIPTION OF PROCEDURE:  The patient was taken to the operating room,  drawn for the reconstruction.  The nipple areolar complex was raised to 20  cm from the suprasternal notch.  She then underwent general anesthesia and  intubated orally.  Prep was done to the chest and breast areas in a routine  fashion using Betadine soap and solution, walled off with sterile towels,  and draped so as to make a sterile field.  Xylocaine 0.25% with epinephrine  1:400,000 concentration was injected locally, 150 cc per side.  After  waiting the appropriate amount of time the wounds were scored with #15  blades and the skin over the inferior pedicles de-epithelialized with #20  blades.  The medial and lateral fatty dermal pedicles were excised down to  underlying fascia.  After proper hemostasis a new keyhole area was also  debulked, and removing excess accessory breast tissue in the lateral aspect  of the breast as well.  After proper hemostasis the flaps were transposed  and stayed with 3-0 Prolene, subcutaneous closure was done with 3-0 Monocryl  x2 layers, and then a running subcuticular stitch  of 3-0 Monocryl and 5-0  Monocryl throughout the inverted T.  The wounds were drained with #10 Blake  drain fully fluted which was placed in the depths of the wound and brought  out through the lateral part of the incision, and secured with 3-0 Prolene.  The wounds were cleansed,  half-inch Steri-Strips and soft dressings applied  to all the areas.  She withstood the procedures very well and was taken to  the recovery in excellent condition.  The patient preoperatively also  demonstrated asymmetry with the left breast greater than the right, removing  over 800 g on the left side and over 500 g on the right.                                                Yaakov Guthrie. Shon Hough, M.D.  GLT/MEDQ  D:  05/17/2002  T:  05/18/2002  Job:  [id number]

## 2011-02-08 NOTE — Op Note (Signed)
[facility]  Patient:    Candice [name], Candice [name]                           MRN: [id number] Proc. Date: 02/09/00 Attending:  Leonard Schwartz                           Operative Report  PREOPERATIVE DIAGNOSIS:       Term intrauterine pregnancy.  Active labor. Breech presentation.  POSTOPERATIVE DIAGNOSIS:      Term intrauterine pregnancy.  Active labor. Footling breech presentation.  OPERATION:                    Primary low transverse cesarean section.  SURGEON:                      Janine Limbo, M.D.  ASSISTANT:                    Erin Sons, C.N.M.  ANESTHESIA:                   Spinal.  ESTIMATED BLOOD LOSS:  INDICATIONS:                  The patient is a 45 year old female, gravida 3, para 0-0-2-0, who presents at term.  She was known to have a breech infant and was scheduled for cesarean delivery.  At approximately 5 a.m. the patient began to labor and she quickly dilated her cervix to 7 cm.  Meconium stained fluid was noted.  The patient understands the indications for her surgical procedure and he accepts the risks of, but not limited to, anesthetic complications, bleeding, infections, and possible damage to the surrounding organs.  FINDINGS:                     A 6 pound 8 ounce female Haynes Bast) was delivered from a footling breech presentation.  The Apgars were 8 at one minute and 9 at five minutes.  There was no meconium noted beneath the vocal cords.  The infant was ot intubated.  The uterus, fallopian tubes, and ovaries were normal.  DESCRIPTION OF PROCEDURE:     The patient was taken to the operating room where a spinal anesthetic was given.  The patients abdomen and perineum were prepped with multiple layers of Betadine.  A Foley catheter was placed in the bladder.  The patient was sterilely draped.  A low transverse incision was made in the abdomen and carried sharply through the subcutaneous tissue, the fascia, and  the anterior peritoneum.  An incision was made in the lower uterine segment and extended transversely.  The infant was delivered from a footling breech presentation without difficulty.  The mouth and nose were suctioned using the DeLee trap.  The cord as clamped and cut.  The infant was handed to the awaiting pediatric team. Routine cord blood studies were obtained.  The placenta was removed.  The uterine cavity was cleaned of amniotic fluid and clotted blood.  The uterine incision was closed using a running locking suture of 2-0 Vicryl.  Hemostasis was adequate.  The pericolonic gutters were cleaned of amniotic fluid and clotted blood.  The anterior peritoneum and the abdominal musculature were reapproximated in the midline. The fascia was closed using a running suture of 0 Vicryl followed by three interrupted sutures of 3-0  Vicryl.  The subcutaneous tissue was irrigated.  Hemostasis was adequate.  The subcutaneous layer was closed using a running suture of 2-0 Vicryl. The skin was reapproximated using skin staples.  Sponge, needle, and instrument  counts were correct x 2 occasions.  The estimated blood loss for the procedure as 600 cc.  The patient tolerated her procedure well.  She was taken to the recovery room in stable condition.  The infant was taken to the fullterm nursery in stable condition. DD:  02/09/00 TD:  02/11/00 Job: [id number]

## 2011-02-08 NOTE — H&P (Signed)
NAMEGERIANN, LAFONT NO.:  [id number]   MEDICAL RECORD NO.:  [id number]          PATIENT TYPE:  INP   LOCATION:  NA                            FACILITY:  [facility]   PHYSICIAN:  Janine Limbo, M.D.DATE OF BIRTH:  October 20, 1965   DATE OF ADMISSION:  DATE OF DISCHARGE:                                HISTORY & PHYSICAL   HISTORY OF PRESENT ILLNESS:  The patient is a 45 year old female, gravida 4,  para 1-0-2-1, who presents at [redacted] weeks gestation, (EDC is August 13, 2004), for repeat cesarean section.  The patient has been followed at the  [facility].  This pregnancy has been largely uncomplicated.  The patient did  have an amniocentesis which showed a normal female infant.   OBSTETRICAL HISTORY:  The patient had an elective pregnancy termination in  1991.  In 2000, she had a miscarriage.  In 2001, she had a cesarean section  at [redacted] weeks gestation because of a breech presentation.  She delivered a 6  pound 8 ounce female infant.   DRUG ALLERGIES:  No known drug allergies.   PAST MEDICAL HISTORY:  The patient has a history of pyelonephritis.  She had  breast surgery in the past.   REVIEW OF SYSTEMS:  Normal pregnancy complaints.   SOCIAL HISTORY:  The patient denies cigarette use, alcohol use, and  recreational drug use.  The patient smoked cigarettes but stopped in 2000.   FAMILY HISTORY:  Noncontributory.   PHYSICAL EXAMINATION:  VITAL SIGNS:  Weight is 188 pounds.  HEENT:  Within normal limits.  CHEST:  Clear.  HEART:  A regular rate and rhythm.  BREASTS:  Her breasts are without masses.  ABDOMEN:  Her abdomen is gravid with a fundal height of 36 cm.  EXTREMITIES:  Within normal limits.  NEUROLOGICAL:  Exam is grossly normal.  PELVIC:  Cervix was closed and long when last checked.   LABORATORY VALUES:  Blood type is O+, antibody screen negative.  Sickle Cell  negative.  VDRL  nonreactive.  Rubella positive.  HBSAG negative.  HIV  nonreactive.  Cystic Fibrosis negative.  Third trimester beta strep is  negative.  Third trimester gonorrhea is negative.  Third trimester Chlamydia  is negative.   ASSESSMENT:  1.  A [redacted] week gestation.  2.  Prior cesarean section.  3.  Desires repeat cesarean section.   PLAN:  The patient will undergo a repeat low transverse cesarean section.  She understands the indications for her procedure and she accepts the risks  of, but not limited to, anesthetic complications, bleeding, infection, and  possible damage to the surrounding organs.     Arth   AVS/MEDQ  D:  07/31/2004  T:  07/31/2004  Job:  [id number]

## 2011-06-25 ENCOUNTER — Other Ambulatory Visit: Payer: Self-pay | Admitting: Obstetrics and Gynecology

## 2011-06-25 DIAGNOSIS — Z1231 Encounter for screening mammogram for malignant neoplasm of breast: Secondary | ICD-10-CM

## 2011-08-05 ENCOUNTER — Ambulatory Visit
Admission: RE | Admit: 2011-08-05 | Discharge: 2011-08-05 | Disposition: A | Discharge status: Home / Self Care | Payer: BC Managed Care – PPO | Source: Ambulatory Visit | Attending: Obstetrics and Gynecology | Admitting: Obstetrics and Gynecology

## 2011-08-05 DIAGNOSIS — Z1231 Encounter for screening mammogram for malignant neoplasm of breast: Secondary | ICD-10-CM

## 2012-04-29 DIAGNOSIS — L309 Dermatitis, unspecified: Secondary | ICD-10-CM

## 2012-04-29 DIAGNOSIS — B359 Dermatophytosis, unspecified: Secondary | ICD-10-CM

## 2012-04-29 HISTORY — DX: Dermatitis, unspecified: L30.9

## 2012-04-29 HISTORY — DX: Dermatophytosis, unspecified: B35.9

## 2012-07-13 ENCOUNTER — Other Ambulatory Visit: Payer: Self-pay | Admitting: Obstetrics and Gynecology

## 2012-07-13 ENCOUNTER — Telehealth: Payer: Self-pay | Admitting: Obstetrics and Gynecology

## 2012-07-13 DIAGNOSIS — Z1231 Encounter for screening mammogram for malignant neoplasm of breast: Secondary | ICD-10-CM

## 2012-07-13 NOTE — Telephone Encounter (Signed)
Returned pt's call regarding funny sensation in breast like "when you are going to breast feed". Pt was advised it could be from ovulation.  Pt denies any pain, lumps or leakage. Pt is scheduled for mammogram on 08/17/2012. Pt stated that she would just proceed with scheduled mammogram,if other symptoms arise or worsen she will call the office for ov. Mathis Bud

## 2012-08-17 ENCOUNTER — Ambulatory Visit
Admission: RE | Admit: 2012-08-17 | Discharge: 2012-08-17 | Disposition: A | Discharge status: Home / Self Care | Payer: BC Managed Care – PPO | Source: Ambulatory Visit | Attending: Obstetrics and Gynecology | Admitting: Obstetrics and Gynecology

## 2012-08-17 DIAGNOSIS — Z1231 Encounter for screening mammogram for malignant neoplasm of breast: Secondary | ICD-10-CM

## 2012-09-24 ENCOUNTER — Encounter: Payer: Self-pay | Admitting: Obstetrics and Gynecology

## 2012-09-29 ENCOUNTER — Ambulatory Visit: Payer: Self-pay | Admitting: Obstetrics and Gynecology

## 2013-07-13 ENCOUNTER — Other Ambulatory Visit: Payer: Self-pay

## 2013-07-13 DIAGNOSIS — Z1231 Encounter for screening mammogram for malignant neoplasm of breast: Secondary | ICD-10-CM

## 2013-07-13 DIAGNOSIS — Z9889 Other specified postprocedural states: Secondary | ICD-10-CM

## 2013-08-18 ENCOUNTER — Ambulatory Visit
Admission: RE | Admit: 2013-08-18 | Discharge: 2013-08-18 | Disposition: A | Discharge status: Home / Self Care | Payer: BC Managed Care – PPO | Source: Ambulatory Visit

## 2013-08-18 DIAGNOSIS — Z9889 Other specified postprocedural states: Secondary | ICD-10-CM

## 2013-08-18 DIAGNOSIS — Z1231 Encounter for screening mammogram for malignant neoplasm of breast: Secondary | ICD-10-CM

## 2014-07-11 ENCOUNTER — Other Ambulatory Visit: Payer: Self-pay

## 2014-07-11 DIAGNOSIS — Z1231 Encounter for screening mammogram for malignant neoplasm of breast: Secondary | ICD-10-CM

## 2014-07-25 ENCOUNTER — Encounter: Payer: Self-pay | Admitting: Obstetrics and Gynecology

## 2014-08-22 ENCOUNTER — Ambulatory Visit
Admission: RE | Admit: 2014-08-22 | Discharge: 2014-08-22 | Disposition: A | Discharge status: Home / Self Care | Payer: BC Managed Care – PPO | Source: Ambulatory Visit

## 2014-08-22 ENCOUNTER — Encounter (INDEPENDENT_AMBULATORY_CARE_PROVIDER_SITE_OTHER): Payer: Self-pay

## 2014-08-22 ENCOUNTER — Ambulatory Visit: Payer: BC Managed Care – PPO

## 2014-08-22 DIAGNOSIS — Z1231 Encounter for screening mammogram for malignant neoplasm of breast: Secondary | ICD-10-CM

## 2015-07-17 ENCOUNTER — Other Ambulatory Visit: Payer: Self-pay

## 2015-07-17 DIAGNOSIS — Z9889 Other specified postprocedural states: Secondary | ICD-10-CM

## 2015-07-17 DIAGNOSIS — Z1231 Encounter for screening mammogram for malignant neoplasm of breast: Secondary | ICD-10-CM

## 2015-08-30 ENCOUNTER — Ambulatory Visit
Admission: RE | Admit: 2015-08-30 | Discharge: 2015-08-30 | Disposition: A | Discharge status: Home / Self Care | Payer: BLUE CROSS/BLUE SHIELD | Source: Ambulatory Visit

## 2015-08-30 DIAGNOSIS — Z9889 Other specified postprocedural states: Secondary | ICD-10-CM

## 2015-08-30 DIAGNOSIS — Z1231 Encounter for screening mammogram for malignant neoplasm of breast: Secondary | ICD-10-CM

## 2017-07-23 ENCOUNTER — Emergency Department (HOSPITAL_COMMUNITY)
Admission: EM | Admit: 2017-07-23 | Discharge: 2017-07-23 | Disposition: A | Discharge status: Home / Self Care | Payer: BLUE CROSS/BLUE SHIELD | Attending: Emergency Medicine | Admitting: Emergency Medicine

## 2017-07-23 ENCOUNTER — Encounter (HOSPITAL_COMMUNITY): Payer: Self-pay

## 2017-07-23 DIAGNOSIS — H9201 Otalgia, right ear: Secondary | ICD-10-CM | POA: Diagnosis not present

## 2017-07-23 NOTE — ED Triage Notes (Signed)
Patient complains of right sided neck and facial pain with radiation to ear x 3 days. Denies cold symptoms, denies trauma. Patient alert and oriented, NAD

## 2017-07-23 NOTE — ED Notes (Addendum)
Pt not found to be in room when provider returned after consulting MD about case. EDP notified.

## 2017-07-23 NOTE — ED Provider Notes (Signed)
[place] EMERGENCY DEPARTMENT Provider Note   CSN: [id number] Arrival date & time: 07/23/17  1030     History   Chief Complaint Chief Complaint  Patient presents with  . neck/fazce pain    HPI Candice [name] is a 51 y.o. female.  HPI   Patient is a 51 year old female with no significant past medical history presenting for pain superior to the right ear.  Patient reports she has had a "lightning strike and "pain 1-2 times an hour every couple months for greater than a year.  Occasionally she will get a soreness that extends down into her right jaw.  Patient denies any visual changes, hearing loss, irruption of rashes in the region of pain, numbness, weakness in her extremities, dizziness, lightheadedness, vertiginous symptoms with this pain.  Patient reports she has tried [place] powder for her symptoms with minimal relief.  Patient reports that this particular episode is the longest lasting and she will get a couple of the shooting pains that last seconds to close to a minute  Past Medical History:  Diagnosis Date  . Anemia   . Chlamydia   . HSV (herpes simplex virus) infection    1 and 2     There are no active problems to display for this patient.   Past Surgical History:  Procedure Laterality Date  . BREAST SURGERY    . CESAREAN SECTION    . PYELOTOMY    . WISDOM TOOTH EXTRACTION      OB History    Gravida Para Term Preterm AB Living   4 2 2   2 2    SAB TAB Ectopic Multiple Live Births   1       2       Home Medications    Prior to Admission medications   Not on File    Family History Family History  Problem Relation Age of Onset  . Hypertension Mother   . Alzheimer's disease Mother   . Depression Mother   . Parkinson's disease Father   . Hypertension Brother   . Depression Brother   . Tuberculosis Maternal Aunt   . Prostate cancer Maternal Uncle   . Lung cancer Maternal Uncle     Social History Social History    Substance Use Topics  . Smoking status: Not on file  . Smokeless tobacco: Never Used  . Alcohol use No     Allergies   Amoxicillin   Review of Systems Review of Systems  HENT: Positive for ear pain.   Eyes: Negative for pain, redness and visual disturbance.  Musculoskeletal: Negative for neck pain and neck stiffness.  Skin: Negative for rash.  Neurological: Negative for dizziness, weakness, light-headedness and numbness.     Physical Exam Updated Vital Signs BP (!) 144/87   Pulse 77   Temp 98.3 F (36.8 C) (Oral)   Resp 18   SpO2 99%   Physical Exam  Constitutional: She appears well-developed and well-nourished. No distress.  Sitting comfortably in bed.  HENT:  Head: Normocephalic and atraumatic.  Eyes: Conjunctivae are normal. Right eye exhibits no discharge. Left eye exhibits no discharge.  EOMs normal to gross examination.  Neck: Normal range of motion.  Cardiovascular: Normal rate, regular rhythm and normal heart sounds.   No murmur heard. Pulmonary/Chest:  Normal respiratory effort. Patient converses comfortably. No audible wheeze or stridor.  Abdominal: She exhibits no distension.  Musculoskeletal: Normal range of motion.  Neurological: She is alert.  Cranial nerves  II through XII intact. Mental Status:  Alert, oriented, thought content appropriate, able to give a coherent history. Speech fluent without evidence of aphasia. Able to follow 2 step commands without difficulty.  Cranial Nerves:  II:  Peripheral visual fields grossly normal, pupils equal, round, reactive to light III,IV, VI: ptosis not present, extra-ocular motions intact bilaterally  V,VII: smile symmetric, facial light touch sensation equal VIII: hearing grossly normal to voice  X: uvula elevates symmetrically  XI: bilateral shoulder shrug symmetric and strong XII: midline tongue extension without fassiculations  Strength 5 out of 5 in upper and lower extremities.  Skin: Skin is warm and  dry. She is not diaphoretic.  Psychiatric: She has a normal mood and affect. Her behavior is normal. Judgment and thought content normal.  Nursing note and vitals reviewed.    ED Treatments / Results  Labs (all labs ordered are listed, but only abnormal results are displayed) Labs Reviewed - No data to display  EKG  EKG Interpretation None       Radiology No results found.  Procedures Procedures (including critical care time)  Medications Ordered in ED Medications - No data to display   Initial Impression / Assessment and Plan / ED Course  I have reviewed the triage vital signs and the nursing notes.  Pertinent labs & imaging results that were available during my care of the patient were reviewed by me and considered in my medical decision making (see chart for details).     Final Clinical Impressions(s) / ED Diagnoses   Final diagnoses:  Right ear pain   Differential diagnosis includes trigeminal neuralgia, MS, cluster headache, herpetic lesions, patient reports this is exactly consistent with her prior episodes, and she has never had a herpetic abruption around her ear or in her ear.  Patient has no other neurologic symptoms.  This is likely trigeminal neuralgia.  I discussed this diagnosis with patient and the need for neurologic follow-up.  This was discussed with Dr. Tanna Furry.  Patient eloped off the floor before follow-up arrangements could be made.  New Prescriptions There are no discharge medications for this patient.    Albesa Seen, PA-C 07/23/17 1402    Tanna Furry, MD 08/04/17 [phone]

## 2017-09-04 NOTE — Progress Notes (Signed)
[facility]    Provider:  Dr Jaynee Eagles Referring Provider: Everardo Beals, NP Primary Care Physician:  Everardo Beals, NP  CC: Headaches  HPI:  Candice [name] is a 51 y.o. female here as a referral from Dr. Benna Dunks for headaches. No significant past medical history.  Headaches for 7 years. Was mild in the beginning and infrequent, worsening. Used to be once a year and now she has had it multiple times this year. She points to her occipital area, brief, sharp, radiating. She has some neck stiffness but the symptoms don;t occur with neck movement, she has been sleeping on the couch. No triggers Denies migrainous features. No head trauma. Can be severe. Nothing makes it better. Can last up to 3 days, multiple shocks an hour, can get sore. Worse with touch and laying down on that side of the head. Positional posterior head pain. No other focal neurologic deficits, associated symptoms, inciting events or modifiable factors.  Reviewed notes, labs and imaging from outside physicians, which showed:   Reviewed referring notes, sharp lightening type headaches to the right side of her head lasting a few seconds to all day, intermittent for the past 7 years, recently more frequent and longer lasting.  TSH normal, hemoglobin A1c 5, LDL 118, CMP with creatinine 0.75 and BUN 9 otherwise unremarkable July 2017, CBC normal July 2017,  Review of Systems: Patient complains of symptoms per HPI as well as the following symptoms: headache. Pertinent negatives and positives per HPI. All others negative.   Social History   Socioeconomic History  . Marital status: Married    Spouse name: Not on file  . Number of children: 2  . Years of education: Not on file  . Highest education level: Some college, no degree  Social Needs  . Financial resource strain: Not on file  . Food insecurity - worry: Not on file  . Food insecurity - inability: Not on file  . Transportation needs -  medical: Not on file  . Transportation needs - non-medical: Not on file  Occupational History  . Occupation: Medical illustrator    Comment: self employed  Tobacco Use  . Smoking status: Former Smoker    Types: Cigars    Last attempt to quit: 07/2011    Years since quitting: 6.1  . Smokeless tobacco: Never Used  . Tobacco comment: quit 1 pack/ week cigarettes in Nov 2012; occasional cigar now  Substance and Sexual Activity  . Alcohol use: Yes    Alcohol/week: 1.8 - 2.4 oz    Types: 3 - 4 Glasses of wine per week  . Drug use: No  . Sexual activity: Yes    Birth control/protection: Condom  Other Topics Concern  . Not on file  Social History Narrative   Lives at home with spouse and 2 daughters   Right handed   Drinks 2 cups of coffee daily    Family History  Problem Relation Age of Onset  . Hypertension Mother   . Alzheimer's disease Mother   . Depression Mother   . Parkinson's disease Father   . Hypertension Brother   . Depression Brother   . Tuberculosis Maternal Aunt   . Prostate cancer Maternal Uncle   . Lung cancer Maternal Uncle     Past Medical History:  Diagnosis Date  . Anemia   . Chlamydia   . HSV (herpes simplex virus) infection    1 and 2     Past Surgical History:  Procedure Laterality Date  .  BREAST SURGERY    . CESAREAN SECTION    . PYELOTOMY    . WISDOM TOOTH EXTRACTION      Current Outpatient Medications  Medication Sig Dispense Refill  . gabapentin (NEURONTIN) 300 MG capsule Take 1 capsule (300 mg total) by mouth 3 (three) times daily as needed. 90 capsule 3  . zolpidem (AMBIEN) 5 MG tablet Take 5 mg by mouth at bedtime as needed for sleep.     No current facility-administered medications for this visit.     Allergies as of 09/05/2017 - Review Complete 09/05/2017  Allergen Reaction Noted  . Amoxicillin  09/24/2012  . Penicillins  09/05/2017    Vitals: BP (!) 144/87 (BP Location: Left Arm, Patient Position: Sitting) Comment: md aware   Pulse (!) 52 Comment: md aware  Ht 5\' 5"  (1.651 m)   Wt 164 lb 6.4 oz (74.6 kg)   BMI 27.36 kg/m  Last Weight:  Wt Readings from Last 1 Encounters:  09/05/17 164 lb 6.4 oz (74.6 kg)   Last Height:   Ht Readings from Last 1 Encounters:  09/05/17 5\' 5"  (1.651 m)   Physical exam: Exam: Gen: NAD, conversant, well nourised, well groomed                     CV: RRR, no MRG. No Carotid Bruits. No peripheral edema, warm, nontender Eyes: Conjunctivae clear without exudates or hemorrhage  Neuro: Detailed Neurologic Exam  Speech:    Speech is normal; fluent and spontaneous with normal comprehension.  Cognition:    The patient is oriented to person, place, and time;     recent and remote memory intact;     language fluent;     normal attention, concentration,     fund of knowledge Cranial Nerves:    The pupils are equal, round, and reactive to light. The fundi are normal and spontaneous venous pulsations are present. Visual fields are full to finger confrontation. Extraocular movements are intact. Trigeminal sensation is intact and the muscles of mastication are normal. The face is symmetric. The palate elevates in the midline. Hearing intact. Voice is normal. Shoulder shrug is normal. The tongue has normal motion without fasciculations.   Coordination:    Normal finger to nose and heel to shin. Normal rapid alternating movements.   Gait:    Heel-toe and tandem gait are normal.   Motor Observation:    No asymmetry, no atrophy, and no involuntary movements noted. Tone:    Normal muscle tone.    Posture:    Posture is normal. normal erect    Strength:    Strength is V/V in the upper and lower limbs.      Sensation: intact to LT     Reflex Exam:  DTR's:    Deep tendon reflexes in the upper and lower extremities are normal bilaterally.   Toes:    The toes are downgoing bilaterally.   Clonus:    Clonus is absent.       Assessment/Plan:  51 year old with worsening  occipital headache, positional, given these concerning symptoms need MRI brain w/wo contrast to evaluate for space-occupying tumor, lesion, structural and infiltrative lesions such as meningioma, schwannoma, myelitis, compressive disk disease and others.  MRI brain w/wo contrast as above for Occipital positional headache  Occipital Neuralgia: Physical Therapy: Cervical myofascial pain and Occipital Neuralgia, . Please evaluate and treat including dry needling, stretching, strengthening, manual therapy/massage, heating, TENS unit, exercising for strengthening as clinically warranted  as well as any other modality as recommended by evaluation.  At onset of pain take Gabapentin 3x a day as needed. Also may start with high dose ibuprofen (800 three times a day) or a taper of steroids or callus for occipital nerve blocks  Orders Placed This Encounter  Procedures  . MR BRAIN W WO CONTRAST  . CBC  . Comprehensive metabolic panel  . Ambulatory referral to Physical Therapy   Cc:  Everardo Beals, NP   Sarina Ill, MD  [facility] [address] [place], [place] [address]  Phone [phone] Fax [phone]

## 2017-09-05 ENCOUNTER — Ambulatory Visit: Payer: BLUE CROSS/BLUE SHIELD | Admitting: Neurology

## 2017-09-05 ENCOUNTER — Encounter: Payer: Self-pay | Admitting: Neurology

## 2017-09-05 VITALS — BP 144/87 | HR 52 | Ht 65.0 in | Wt 164.4 lb

## 2017-09-05 DIAGNOSIS — R51 Headache with orthostatic component, not elsewhere classified: Secondary | ICD-10-CM

## 2017-09-05 DIAGNOSIS — R519 Headache, unspecified: Secondary | ICD-10-CM

## 2017-09-05 DIAGNOSIS — M5481 Occipital neuralgia: Secondary | ICD-10-CM | POA: Diagnosis not present

## 2017-09-05 MED ORDER — GABAPENTIN 300 MG PO CAPS: 300.0000 mg | ORAL_CAPSULE | Freq: Three times a day (TID) | ORAL | 3 refills | Status: DC | PRN

## 2017-09-05 NOTE — Patient Instructions (Addendum)
At onset of pain take Gabapentin 3x a day as needed. Also may start with high dose ibuprofen (800 three times a day) or a taper of steroids or callus for occipital nerve blocks   Gabapentin capsules or tablets What is this medicine? GABAPENTIN (GA ba pen tin) is used to control partial seizures in adults with epilepsy. It is also used to treat certain types of nerve pain. This medicine may be used for other purposes; ask your health care provider or pharmacist if you have questions. COMMON BRAND NAME(S): Active-PAC with Gabapentin, Gabarone, Neurontin What should I tell my health care provider before I take this medicine? They need to know if you have any of these conditions: -kidney disease -suicidal thoughts, plans, or attempt; a previous suicide attempt by you or a family member -an unusual or allergic reaction to gabapentin, other medicines, foods, dyes, or preservatives -pregnant or trying to get pregnant -breast-feeding How should I use this medicine? Take this medicine by mouth with a glass of water. Follow the directions on the prescription label. You can take it with or without food. If it upsets your stomach, take it with food.Take your medicine at regular intervals. Do not take it more often than directed. Do not stop taking except on your doctor's advice. If you are directed to break the 600 or 800 mg tablets in half as part of your dose, the extra half tablet should be used for the next dose. If you have not used the extra half tablet within 28 days, it should be thrown away. A special MedGuide will be given to you by the pharmacist with each prescription and refill. Be sure to read this information carefully each time. Talk to your pediatrician regarding the use of this medicine in children. Special care may be needed. Overdosage: If you think you have taken too much of this medicine contact a poison control center or emergency room at once. NOTE: This medicine is only for you. Do  not share this medicine with others. What if I miss a dose? If you miss a dose, take it as soon as you can. If it is almost time for your next dose, take only that dose. Do not take double or extra doses. What may interact with this medicine? Do not take this medicine with any of the following medications: -other gabapentin products This medicine may also interact with the following medications: -alcohol -antacids -antihistamines for allergy, cough and cold -certain medicines for anxiety or sleep -certain medicines for depression or psychotic disturbances -homatropine; hydrocodone -naproxen -narcotic medicines (opiates) for pain -phenothiazines like chlorpromazine, mesoridazine, prochlorperazine, thioridazine This list may not describe all possible interactions. Give your health care provider a list of all the medicines, herbs, non-prescription drugs, or dietary supplements you use. Also tell them if you smoke, drink alcohol, or use illegal drugs. Some items may interact with your medicine. What should I watch for while using this medicine? Visit your doctor or health care professional for regular checks on your progress. You may want to keep a record at home of how you feel your condition is responding to treatment. You may want to share this information with your doctor or health care professional at each visit. You should contact your doctor or health care professional if your seizures get worse or if you have any new types of seizures. Do not stop taking this medicine or any of your seizure medicines unless instructed by your doctor or health care professional. Stopping your medicine suddenly  can increase your seizures or their severity. Wear a medical identification bracelet or chain if you are taking this medicine for seizures, and carry a card that lists all your medications. You may get drowsy, dizzy, or have blurred vision. Do not drive, use machinery, or do anything that needs mental  alertness until you know how this medicine affects you. To reduce dizzy or fainting spells, do not sit or stand up quickly, especially if you are an older patient. Alcohol can increase drowsiness and dizziness. Avoid alcoholic drinks. Your mouth may get dry. Chewing sugarless gum or sucking hard candy, and drinking plenty of water will help. The use of this medicine may increase the chance of suicidal thoughts or actions. Pay special attention to how you are responding while on this medicine. Any worsening of mood, or thoughts of suicide or dying should be reported to your health care professional right away. Women who become pregnant while using this medicine may enroll in the [place] Pregnancy Registry by calling [phone]. This registry collects information about the safety of antiepileptic drug use during pregnancy. What side effects may I notice from receiving this medicine? Side effects that you should report to your doctor or health care professional as soon as possible: -allergic reactions like skin rash, itching or hives, swelling of the face, lips, or tongue -worsening of mood, thoughts or actions of suicide or dying Side effects that usually do not require medical attention (report to your doctor or health care professional if they continue or are bothersome): -constipation -difficulty walking or controlling muscle movements -dizziness -nausea -slurred speech -tiredness -tremors -weight gain This list may not describe all possible side effects. Call your doctor for medical advice about side effects. You may report side effects to FDA at 1-800-FDA-1088. Where should I keep my medicine? Keep out of reach of children. This medicine may cause accidental overdose and death if it taken by other adults, children, or pets. Mix any unused medicine with a substance like cat litter or coffee grounds. Then throw the medicine away in a sealed container like a sealed  bag or a coffee can with a lid. Do not use the medicine after the expiration date. Store at room temperature between 15 and 30 degrees C (59 and 86 degrees F). NOTE: This sheet is a summary. It may not cover all possible information. If you have questions about this medicine, talk to your doctor, pharmacist, or health care provider.  2018 Elsevier/Gold Standard (2013-11-05 15:26:50)  Occipital Neuralgia Occipital neuralgia is a type of headache that causes episodes of very bad pain in the back of your head. Pain from occipital neuralgia may spread (radiate) to other parts of your head. The pain is usually brief and often goes away after you rest and relax. These headaches may be caused by irritation of the nerves that leave your spinal cord high up in your neck, just below the base of your skull (occipital nerves). Your occipital nerves transmit sensations from the back of your head, the top of your head, and the areas behind your ears. What are the causes? Occipital neuralgia can occur without any known cause (primary headache syndrome). In other cases, occipital neuralgia is caused by pressure on or irritation of one of the two occipital nerves. Causes of occipital nerve compression or irritation include:  Wear and tear of the vertebrae in the neck (osteoarthritis).  Neck injury.  Disease of the disks that separate the vertebrae.  Tumors.  Gout.  Infections.  Diabetes.  Swollen blood vessels that put pressure on the occipital nerves.  Muscle spasm in the neck.  What are the signs or symptoms? Pain is the main symptom of occipital neuralgia. It usually starts in the back of the head but may also be felt in other areas supplied by the occipital nerves. Pain is usually on one side but may be on both sides. You may have:  Brief episodes of very bad pain that is burning, stabbing, shocking, or shooting.  Pain behind the eye.  Pain triggered by neck movement or hair  brushing.  Scalp tenderness.  Aching in the back of the head between episodes of very bad pain.  How is this diagnosed? Your health care provider may diagnose occipital neuralgia based on your symptoms and a physical exam. During the exam, the health care provider may push on areas supplied by the occipital nerves to see if they are painful. Some tests may also be done to help in making the diagnosis. These may include:  Imaging studies of the upper spinal cord, such as an MRI or CT scan. These may show compression or spinal cord abnormalities.  Nerve block. You will get an injection of numbing medicine (local anesthetic) near the occipital nerve to see if this relieves pain.  How is this treated? Treatment may begin with simple measures, such as:  Rest.  Massage.  Heat.  Over-the-counter pain relievers.  If these measures do not work, you may need other treatments, including:  Medicines such as: ? Prescription-strength anti-inflammatory medicines. ? Muscle relaxants. ? Antiseizure medicines. ? Antidepressants.  Steroid injection. This involves injections of local anesthetic and strong anti-inflammatory drugs (steroids).  Pulsed radiofrequency. Wires are implanted to deliver electrical impulses that block pain signals from the occipital nerve.  Physical therapy.  Surgery to relieve nerve pressure.  Follow these instructions at home:  Take all medicines as directed by your health care provider.  Avoid activities that cause pain.  Rest when you have an attack of pain.  Try gentle massage or a heating pad to relieve pain.  Work with a physical therapist to learn stretching exercises you can do at home.  Try a different pillow or sleeping position.  Practice good posture.  Try to stay active. Get regular exercise that does not cause pain. Ask your health care provider to suggest safe exercises for you.  Keep all follow-up visits as directed by your health care  provider. This is important. Contact a health care provider if:  Your medicine is not working.  You have new or worsening symptoms. Get help right away if:  You have very bad head pain that is not going away.  You have a sudden change in vision, balance, or speech. This information is not intended to replace advice given to you by your health care provider. Make sure you discuss any questions you have with your health care provider. Document Released: 09/03/2001 Document Revised: 02/15/2016 Document Reviewed: 09/01/2013 Elsevier Interactive Patient Education  2017 [place].  Occipital Nerve Block Patient Information  Description: The occipital nerves originate in the cervical (neck) spinal cord and travel upward through muscle and tissue to supply sensation to the back of the head and top of the scalp.  In addition, the nerves control some of the muscles of the scalp.  Occipital neuralgia is an irritation of these nerves which can cause headaches, numbness of the scalp, and neck discomfort.     The occipital nerve block will interrupt nerve transmission  through these nerves and can relieve pain and spasm.  The block consists of insertion of a small needle under the skin in the back of the head to deposit local anesthetic (numbing medicine) and/or steroids around the nerve.  The entire block usually lasts less than 5 minutes.  Conditions which may be treated by occipital blocks:   Muscular pain and spasm of the scalp  Nerve irritation, back of the head  Headaches  Upper neck pain  Preparation for the injection:  1. Do not eat any solid food or dairy products within 8 hours of your appointment. 2. You may drink clear liquids up to 3 hours before appointment.  Clear liquids include water, black coffee, juice or soda.  No milk or cream please. 3. You may take your regular medication, including pain medications, with a sip of water before you appointment.  Diabetics should hold  regular insulin (if taken separately) and take 1/2 normal NPH dose the morning of the procedure.  Carry some sugar containing items with you to your appointment. 4. A driver must accompany you and be prepared to drive you home after your procedure. 5. Bring all your current medications with you. 6. An IV may be inserted and sedation may be given at the discretion of the physician. 7. A blood pressure cuff, EKG, and other monitors will often be applied during the procedure.  Some patients may need to have extra oxygen administered for a short period. 8. You will be asked to provide medical information, including your allergies and medications, prior to the procedure.  We must know immediately if you are taking blood thinners (like Coumadin/Warfarin) or if you are allergic to IV iodine contrast (dye).  We must know if you could possible be pregnant.  9. Do not wear a high collared shirt or turtleneck.  Tie long hair up in the back if possible.  Possible side-effects:   Bleeding from needle site  Infection (rare, may require surgery)  Nerve injury (rare)  Hair on back of neck can be tinged with iodine scrub (this will wash out)  Light-headedness (temporary)  Pain at injection site (several days)  Decreased blood pressure (rare, temporary)  Seizure (very rare)  Call if you experience:   Hives or difficulty breathing ( go to the emergency room)  Inflammation or drainage at the injection site(s)  Please note:  Although the local anesthetic injected can often make your painful muscles or headache feel good for several hours after the injection, the pain may return.  It takes 3-7 days for steroids to work.  You may not notice any pain relief for at least one week.  If effective, we will often do a series of injections spaced 3-6 weeks apart to maximally decrease your pain.  If you have any questions, please call [phone] [facility]

## 2017-09-06 LAB — COMPREHENSIVE METABOLIC PANEL
ALT: 20 IU/L (ref 0–32)
AST: 25 IU/L (ref 0–40)
Albumin/Globulin Ratio: 1.6 (ref 1.2–2.2)
Albumin: 4.2 g/dL (ref 3.5–5.5)
Alkaline Phosphatase: 60 IU/L (ref 39–117)
BUN/Creatinine Ratio: 19 (ref 9–23)
BUN: 13 mg/dL (ref 6–24)
Bilirubin Total: 1 mg/dL (ref 0.0–1.2)
CO2: 23 mmol/L (ref 20–29)
Calcium: 9.3 mg/dL (ref 8.7–10.2)
Chloride: 105 mmol/L (ref 96–106)
Creatinine, Ser: 0.69 mg/dL (ref 0.57–1.00)
GFR calc Af Amer: 118 mL/min/{1.73_m2} (ref >59)
GFR calc non Af Amer: 103 mL/min/{1.73_m2} (ref >59)
Globulin, Total: 2.6 g/dL (ref 1.5–4.5)
Glucose: 83 mg/dL (ref 65–99)
Potassium: 4.1 mmol/L (ref 3.5–5.2)
Sodium: 139 mmol/L (ref 134–144)
Total Protein: 6.8 g/dL (ref 6.0–8.5)

## 2017-09-06 LAB — CBC
Hematocrit: 40.7 % (ref 34.0–46.6)
Hemoglobin: 13.1 g/dL (ref 11.1–15.9)
MCH: 30.9 pg (ref 26.6–33.0)
MCHC: 32.2 g/dL (ref 31.5–35.7)
MCV: 96 fL (ref 79–97)
Platelets: 214 10*3/uL (ref 150–379)
RBC: 4.24 x10E6/uL (ref 3.77–5.28)
RDW: 13 % (ref 12.3–15.4)
WBC: 10.3 10*3/uL (ref 3.4–10.8)

## 2017-09-08 ENCOUNTER — Telehealth: Payer: Self-pay | Admitting: *Deleted

## 2017-09-08 NOTE — Telephone Encounter (Addendum)
Called and spoke with patient. She verbalized understanding that her labs are normal. Next step is to get MRI brain. She had questions or concerns.     ----- Message from Melvenia Beam, MD sent at 09/07/2017  1:27 PM EST ----- Labs nornal

## 2017-09-18 ENCOUNTER — Ambulatory Visit: Payer: BLUE CROSS/BLUE SHIELD | Admitting: Physical Therapy

## 2017-09-24 ENCOUNTER — Telehealth: Payer: Self-pay | Admitting: Neurology

## 2017-09-24 NOTE — Telephone Encounter (Signed)
Reviewed report from [facility], MRI of the brain is normal, please call patient thanks

## 2017-09-25 ENCOUNTER — Telehealth: Payer: Self-pay | Admitting: Neurology

## 2017-09-25 NOTE — Telephone Encounter (Signed)
Pt has had the testing done that was suggested by Dr Jaynee Eagles but hasn't been the physical therapy yet. Pt wanted to call and discuss that her pain usually starts from the right side of her head but its been moving to the left and starting on the left side please call to discuss

## 2017-09-25 NOTE — Telephone Encounter (Addendum)
Called patient back and discussed her concerns and her MRI results. She is aware that her MRI brain is normal. She stated that she has noticed some pain on the left side of her head today. She denies any other symptoms. She is going to proceed with physical therapy. Told patient that I would pass message along to Dr. Jaynee Eagles and if needed I will call back. Otherwise, pt will call us if pain does not go away or  and is aware that if new symptoms develop to go to ED or call 911.

## 2017-09-25 NOTE — Telephone Encounter (Signed)
I would have her go to PT. If symptoms do not improve with PT then I would suggest an MRI of the cervical spine to look for any causes in her neck for the pain. Thanks

## 2017-09-25 NOTE — Telephone Encounter (Signed)
Called patient, see other phone encounter.

## 2017-09-26 NOTE — Telephone Encounter (Signed)
Called and LVM asking for call back. Please tell her Dr. Cathren Laine latest message when she calls back or I can talk with her.

## 2017-09-30 NOTE — Telephone Encounter (Signed)
Called and spoke with the patient. I informed her that Dr. Jaynee Eagles would have her to go to PT. If symptoms do not improve with PT then she would suggest an MRI of the cervical spine to look for any causes of the pain in her neck. She verbalized understanding and appreciation. She stated she has not been to PT yet (needed to r/s) and will call them to schedule.

## 2017-11-03 ENCOUNTER — Telehealth: Payer: Self-pay | Admitting: *Deleted

## 2017-11-03 NOTE — Telephone Encounter (Signed)
Called pt and LVM asking for call back.   Received refill request for Gabapentin 300 mg capsule, 1 capsule TID PRN, 90 day supply. Will check with patient and see if this new prescription is working well for her.

## 2017-11-04 MED ORDER — GABAPENTIN 300 MG PO CAPS: 300.0000 mg | ORAL_CAPSULE | Freq: Three times a day (TID) | ORAL | 3 refills | Status: DC | PRN

## 2017-11-04 NOTE — Telephone Encounter (Signed)
Pt is returning RN call, stating that she hasnt had another episode so she would assume the new medication has been working well

## 2017-11-04 NOTE — Telephone Encounter (Signed)
Refilled Gabapentin 300 mg TID PRN, #90, 3 refills per Dr. Jaynee Eagles.

## 2017-11-05 ENCOUNTER — Ambulatory Visit: Payer: BLUE CROSS/BLUE SHIELD | Attending: Neurology | Admitting: Physical Therapy

## 2017-11-05 ENCOUNTER — Encounter: Payer: Self-pay | Admitting: Physical Therapy

## 2017-11-05 ENCOUNTER — Other Ambulatory Visit: Payer: Self-pay

## 2017-11-05 DIAGNOSIS — R252 Cramp and spasm: Secondary | ICD-10-CM | POA: Insufficient documentation

## 2017-11-05 NOTE — Therapy (Signed)
[place] [place], [place], [address] Phone: [phone]   Fax:  [phone]  Physical Therapy Evaluation  Patient Details  Name: Candice [name] MRN: [id number] Date of Birth: [birth year] Referring Provider: Jaynee Eagles   Encounter Date: 11/05/2017  PT End of Session - 11/05/17 1637    Visit Number  1    Date for PT Re-Evaluation  01/03/18    PT Start Time  3546    PT Stop Time  1615    PT Time Calculation (min)  44 min    Activity Tolerance  Patient tolerated treatment well    Behavior During Therapy  [facility] for tasks assessed/performed       Past Medical History:  Diagnosis Date  . Anemia   . Chlamydia   . HSV (herpes simplex virus) infection    1 and 2     Past Surgical History:  Procedure Laterality Date  . BREAST SURGERY    . CESAREAN SECTION    . PYELOTOMY    . WISDOM TOOTH EXTRACTION      There were no vitals filed for this visit.   Subjective Assessment - 11/05/17 1541    Subjective  Patient reports that she has had some occipt=ital HA type symptoms for about 12-15 years.  She reports that it would happen 3x/year and last minutes.  She reports that int he past 3 years the frequency increased to 3x/year and last a day, until last year it was lating over a day.  She is unsure of any specific causes.    Patient Stated Goals  have less frequency of HA    Currently in Pain?  Yes    Pain Score  0-No pain    Pain Location  Head    Pain Orientation  Right;Posterior    Pain Descriptors / Indicators  Shooting;Sharp    Pain Type  Acute pain    Pain Radiating Towards  from occiout up to posterior right side    Pain Onset  More than a month ago    Pain Frequency  Intermittent    Aggravating Factors   unsure     Pain Relieving Factors  nothing that she knows of helps    Effect of Pain on Daily Activities  when it happens it really limits everything, "I can function but I am not doing much"          [facility] PT Assessment - 11/05/17 0001      Assessment   Medical Diagnosis  occipital neuralgia    Referring Provider  Jaynee Eagles    Onset Date/Surgical Date  09/04/17    Prior Therapy  no      Precautions   Precautions  None      Balance Screen   Has the patient fallen in the past 6 months  No    Has the patient had a decrease in activity level because of a fear of falling?   No    Is the patient reluctant to leave their home because of a fear of falling?   No      Home Environment   Additional Comments  older kids,       Prior Function   Level of Independence  Independent    Vocation  Full time employment    Vocation Requirements  mostly on the computer, stress    Leisure  runs 2days per week, [employer] 2x/week      Posture/Postural  Control   Posture Comments  mild fwd head, some rounded shoulders      ROM / Strength   AROM / PROM / Strength  AROM;Strength      AROM   Overall AROM Comments  cervical ROM was Mildly limited 10-15 % with side bending and extension, some tightness with rotation, shoulder ROM was WNL's      Strength   Overall Strength Comments  WNL's      Flexibility   Soft Tissue Assessment /Muscle Length  -- some tightness in the upper traps and the levator      Palpation   Palpation comment  very tight in the upper traps and levator mms as well as the cervical paraspinals             Objective measurements completed on examination: See above findings.      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, educated on stretches, breaking time from computer and exercises that she can do at her desk             PT Education - 11/05/17 1636    Education provided  Yes    Education Details  gave upper trap and levator stretches, as well as scalenes    Person(s) Educated  Patient    Methods   Explanation;Demonstration;Verbal cues;Tactile cues;Handout    Comprehension  Verbalized understanding;Returned demonstration          PT Long Term Goals - 11/05/17 1642      PT LONG TERM GOAL #1   Title  independent with HEP    Time  1    Period  Weeks    Status  Achieved             Plan - 11/05/17 1637    Clinical Impression Statement  Patient reports that she has had some occipital neuralgia type HA's since she was 52 y.o.  She reports that they were about 1x/year and would last just a few seconds.  She report that over the past few years they have been more frequent and the HA can last a day or more.  The HA originates int he right mastoid/occipital area and goes up the right side a little.  She has spasms and tightness in the upper trap and levator, when I reviewed the MRI it was only of the Head and face and did not include the cervical area.  I explained the possibility of DDD of teh cervical spine    Clinical Presentation  Stable    Clinical Decision Making  Low    Rehab Potential  Good    PT Frequency  1x / week    PT Duration  8 weeks    PT Treatment/Interventions  ADLs/Self Care Home Management;Electrical Stimulation;Manual techniques;Traction;Dry needling    PT Next Visit Plan  I gave HEP and patient able to do, I explained sub occipital release and gave info about TENS for future use.  We will Hold    Consulted and Agree with Plan of Care  Patient       Patient will benefit from skilled therapeutic intervention in order to improve the following deficits and impairments:  Decreased range of motion, Increased fascial restricitons, Increased muscle spasms, Pain, Improper body mechanics, Postural dysfunction  Visit Diagnosis: Cramp and spasm - Plan: PT plan of care cert/re-cert  Problem List Patient Active Problem List   Diagnosis Date Noted  . Occipital neuralgia of right side 09/05/2017    Sumner Boast., PT 11/05/2017, 4:45 PM  [facility], [place], [address] Phone: [phone]   Fax:  [phone]  Name: Candice [name] MRN: [id number] Date of Birth: Aug 23, 1966

## 2018-01-28 LAB — BASIC METABOLIC PANEL
BUN: 9 (ref 4–21)
Creatinine: 0.7 (ref 0.5–1.1)
Glucose: 83
Potassium: 4.6 (ref 3.4–5.3)
Sodium: 142 (ref 137–147)

## 2018-01-28 LAB — CBC AND DIFFERENTIAL
Hemoglobin: 13.2 (ref 12.0–16.0)
WBC: 6.2

## 2018-01-28 LAB — LIPID PANEL: Cholesterol: 215 — AB (ref 0–200)

## 2018-01-28 LAB — TSH: TSH: 0.85 (ref 0.41–5.90)

## 2018-08-03 ENCOUNTER — Telehealth: Payer: Self-pay

## 2018-08-03 NOTE — Telephone Encounter (Signed)
Rec'd medical records from [facility] - forwarding to [facility] per patient request 08/03/18 LM

## 2018-10-13 ENCOUNTER — Encounter: Payer: Self-pay | Admitting: Family Medicine

## 2018-10-14 ENCOUNTER — Ambulatory Visit (INDEPENDENT_AMBULATORY_CARE_PROVIDER_SITE_OTHER): Payer: BLUE CROSS/BLUE SHIELD | Admitting: Family Medicine

## 2018-10-14 ENCOUNTER — Encounter: Payer: Self-pay | Admitting: Family Medicine

## 2018-10-14 ENCOUNTER — Encounter: Payer: Self-pay | Admitting: Gastroenterology

## 2018-10-14 VITALS — BP 140/78 | HR 50 | Temp 98.5°F | Ht 64.5 in | Wt 156.4 lb

## 2018-10-14 DIAGNOSIS — D649 Anemia, unspecified: Secondary | ICD-10-CM | POA: Diagnosis not present

## 2018-10-14 DIAGNOSIS — Z0000 Encounter for general adult medical examination without abnormal findings: Secondary | ICD-10-CM | POA: Insufficient documentation

## 2018-10-14 DIAGNOSIS — Z1211 Encounter for screening for malignant neoplasm of colon: Secondary | ICD-10-CM

## 2018-10-14 DIAGNOSIS — Z13220 Encounter for screening for lipoid disorders: Secondary | ICD-10-CM | POA: Diagnosis not present

## 2018-10-14 DIAGNOSIS — Z23 Encounter for immunization: Secondary | ICD-10-CM

## 2018-10-14 LAB — LIPID PANEL
Cholesterol: 216 mg/dL — ABNORMAL HIGH (ref 0–200)
HDL: 75.4 mg/dL (ref >39.00)
LDL Cholesterol: 125 mg/dL — ABNORMAL HIGH (ref 0–99)
NonHDL: 140.11
Total CHOL/HDL Ratio: 3
Triglycerides: 76 mg/dL (ref 0.0–149.0)
VLDL: 15.2 mg/dL (ref 0.0–40.0)

## 2018-10-14 LAB — COMPREHENSIVE METABOLIC PANEL
ALT: 22 U/L (ref 0–35)
AST: 21 U/L (ref 0–37)
Albumin: 4.5 g/dL (ref 3.5–5.2)
Alkaline Phosphatase: 60 U/L (ref 39–117)
BUN: 11 mg/dL (ref 6–23)
CO2: 26 mEq/L (ref 19–32)
Calcium: 9.6 mg/dL (ref 8.4–10.5)
Chloride: 104 mEq/L (ref 96–112)
Creatinine, Ser: 0.67 mg/dL (ref 0.40–1.20)
GFR: 112.6 mL/min (ref >60.00)
Glucose, Bld: 92 mg/dL (ref 70–99)
Potassium: 3.8 mEq/L (ref 3.5–5.1)
Sodium: 138 mEq/L (ref 135–145)
Total Bilirubin: 1.5 mg/dL — ABNORMAL HIGH (ref 0.2–1.2)
Total Protein: 7 g/dL (ref 6.0–8.3)

## 2018-10-14 LAB — CBC WITH DIFFERENTIAL/PLATELET
Basophils Absolute: 0.1 10*3/uL (ref 0.0–0.1)
Basophils Relative: 1.1 % (ref 0.0–3.0)
Eosinophils Absolute: 0.1 10*3/uL (ref 0.0–0.7)
Eosinophils Relative: 1.6 % (ref 0.0–5.0)
HCT: 40.7 % (ref 36.0–46.0)
Hemoglobin: 13.7 g/dL (ref 12.0–15.0)
Lymphocytes Relative: 30.8 % (ref 12.0–46.0)
Lymphs Abs: 1.8 10*3/uL (ref 0.7–4.0)
MCHC: 33.6 g/dL (ref 30.0–36.0)
MCV: 93.9 fl (ref 78.0–100.0)
Monocytes Absolute: 0.3 10*3/uL (ref 0.1–1.0)
Monocytes Relative: 4.4 % (ref 3.0–12.0)
Neutro Abs: 3.7 10*3/uL (ref 1.4–7.7)
Neutrophils Relative %: 62.1 % (ref 43.0–77.0)
Platelets: 204 10*3/uL (ref 150.0–400.0)
RBC: 4.34 Mil/uL (ref 3.87–5.11)
RDW: 13.2 % (ref 11.5–15.5)
WBC: 5.9 10*3/uL (ref 4.0–10.5)

## 2018-10-14 LAB — TSH: TSH: 1.18 u[IU]/mL (ref 0.35–4.50)

## 2018-10-14 NOTE — Patient Instructions (Signed)
Great to meet you. I will call you with your lab results from today and you can view them online.   We will call you with your GI appointment.

## 2018-10-14 NOTE — Progress Notes (Signed)
Subjective:   Patient ID: Candice [name], female    DOB: 1966/06/10, 53 y.o.   MRN: [id number]  Candice [name] is a pleasant 53 y.o. year old female who presents to clinic today with New Patient (Initial Visit) (Patient is here today to establish care.) and Annual Exam (Patient is here today to get a CPE. She gets PAPs & Mammograms at [facility] and it is in February she will have results shared with [place].  She is currently fasting.  She would like to be referred to get a Colonoscopy done. She declines HIV lab draw.  Agrees to get Tdap.)  on 10/14/2018  HPI:  Here to establish care and for CPE.  [facility] has appt scheduled for pap smear and mammogram next month.  She has never had a colonoscopy.  She would like a referral for this.  Denies any changes in her bowel habits, blood in her stool or family history of colon cancer.  She is still getting periods.  She is a runner.  No CP or SOB. She goes to [employer].  2 girls - 52 and 51 yo. 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  Current Outpatient Medications on File Prior to Visit  Medication Sig Dispense Refill  . gabapentin (NEURONTIN) 300 MG capsule Take 1 capsule (300 mg total) by mouth 3 (three) times daily as needed. 90 capsule 3  . zolpidem (AMBIEN) 10 MG tablet Take 10 mg by mouth at bedtime as needed for sleep.     No current facility-administered medications on file prior to visit.     No Active Allergies  Past Medical History:  Diagnosis Date  . Anemia   . Chlamydia   . Eczema 04/29/2012  . HSV (herpes simplex virus) infection    1 and 2   . Tinea 04/29/2012    Past Surgical History:  Procedure Laterality Date  . BREAST SURGERY    . CESAREAN SECTION    . PYELOTOMY    . SKIN BIOPSY    . WISDOM TOOTH EXTRACTION      Family History  Problem Relation Age of Onset  . Hypertension Mother   . Alzheimer's disease Mother   . Depression  Mother   . Eczema Mother   . Stroke Mother   . Parkinson's disease Father   . Hypertension Brother   . Depression Brother   . Tuberculosis Maternal Aunt   . Prostate cancer Maternal Uncle   . Lung cancer Maternal Uncle     Social History   Socioeconomic History  . Marital status: Married    Spouse name: Not on file  . Number of children: 2  . Years of education: Not on file  . Highest education level: Some college, no degree  Occupational History  . Occupation: Medical illustrator    Comment: self employed  Social Needs  . Financial resource strain: Not on file  . Food insecurity:    Worry: Not on file    Inability: Not on file  . Transportation needs:    Medical: Not on file    Non-medical: Not on file  Tobacco Use  . Smoking status: Former Smoker    Types: Cigars    Last attempt to quit: 07/2011    Years since quitting: 7.2  . Smokeless tobacco: Never Used  . Tobacco comment: quit 1 pack/ week cigarettes in Nov 2012; occasional cigar now  Substance and Sexual Activity  .  Alcohol use: Yes    Alcohol/week: 3.0 - 4.0 standard drinks    Types: 3 - 4 Glasses of wine per week  . Drug use: No  . Sexual activity: Yes    Birth control/protection: Condom  Lifestyle  . Physical activity:    Days per week: Not on file    Minutes per session: Not on file  . Stress: Not on file  Relationships  . Social connections:    Talks on phone: Not on file    Gets together: Not on file    Attends religious service: Not on file    Active member of club or organization: Not on file    Attends meetings of clubs or organizations: Not on file    Relationship status: Not on file  . Intimate partner violence:    Fear of current or ex partner: Not on file    Emotionally abused: Not on file    Physically abused: Not on file    Forced sexual activity: Not on file  Other Topics Concern  . Not on file  Social History Narrative   Lives at home with spouse and 2 daughters   Right handed    Drinks 2 cups of coffee daily   The PMH, PSH, Social History, Family History, Medications, and allergies have been reviewed in [facility], and have been updated if relevant.   Review of Systems  Constitutional: Negative.   HENT: Negative.   Eyes: Negative.   Respiratory: Negative.   Cardiovascular: Negative.   Gastrointestinal: Negative.   Endocrine: Negative.   Genitourinary: Negative.   Musculoskeletal: Negative.   Skin: Negative.   Allergic/Immunologic: Negative.   Neurological: Negative.   Hematological: Negative.   Psychiatric/Behavioral: Negative.   All other systems reviewed and are negative.      Objective:    BP 140/78 (BP Location: Left Arm, Patient Position: Sitting, Cuff Size: Normal)   Pulse (!) 50   Temp 98.5 F (36.9 C) (Oral)   Ht 5' 4.5" (1.638 m)   Wt 156 lb 6.4 oz (70.9 kg)   LMP 10/06/2018   SpO2 98%   BMI 26.43 kg/m    Physical Exam   General:  Well-developed,well-nourished,in no acute distress; alert,appropriate and cooperative throughout examination Head:  normocephalic and atraumatic.   Eyes:  vision grossly intact, PERRL Ears:  R ear normal and L ear normal externally, TMs clear bilaterally Nose:  no external deformity.   Mouth:  good dentition.   Neck:  No deformities, masses, or tenderness noted. Lungs:  Normal respiratory effort, chest expands symmetrically. Lungs are clear to auscultation, no crackles or wheezes. Heart:  Normal rate and regular rhythm. S1 and S2 normal without gallop, murmur, click, rub or other extra sounds. Abdomen:  Bowel sounds positive,abdomen soft and non-tender without masses, organomegaly or hernias noted. Msk:  No deformity or scoliosis noted of thoracic or lumbar spine.   Extremities:  No clubbing, cyanosis, edema, or deformity noted with normal full range of motion of all joints.   Neurologic:  alert & oriented X3 and gait normal.   Skin:  Intact without suspicious lesions or rashes Cervical Nodes:  No  lymphadenopathy noted Axillary Nodes:  No palpable lymphadenopathy Psych:  Cognition and judgment appear intact. Alert and cooperative with normal attention span and concentration. No apparent delusions, illusions, hallucinations        Assessment & Plan:   Well woman exam without gynecological exam  Screening for colon cancer - Plan: Ambulatory referral to Gastroenterology  Need for Tdap vaccination - Plan: Tdap vaccine greater than or equal to 7yo IM No follow-ups on file.

## 2018-10-14 NOTE — Assessment & Plan Note (Signed)
Reviewed preventive care protocols, scheduled due services, and updated immunizations Discussed nutrition, exercise, diet, and healthy lifestyle.  Orders Placed This Encounter  Procedures  . Tdap vaccine greater than or equal to 51yo IM  . Ambulatory referral to Gastroenterology   GI referral placed.

## 2018-10-14 NOTE — Addendum Note (Signed)
Addended by: Lucille Passy on: 10/14/2018 08:15 AM   Modules accepted: Orders

## 2018-10-16 ENCOUNTER — Other Ambulatory Visit: Payer: Self-pay

## 2018-10-16 ENCOUNTER — Ambulatory Visit (AMBULATORY_SURGERY_CENTER): Payer: BLUE CROSS/BLUE SHIELD | Admitting: *Deleted

## 2018-10-16 DIAGNOSIS — Z1211 Encounter for screening for malignant neoplasm of colon: Secondary | ICD-10-CM

## 2018-10-16 MED ORDER — SUPREP BOWEL PREP KIT 17.5-3.13-1.6 GM/177ML PO SOLN: 1.0000 | Freq: Once | ORAL | 0 refills | Status: AC

## 2018-10-16 NOTE — Progress Notes (Signed)
No egg or soy allergy known to patient  No issues with past sedation with any surgeries  or procedures, no intubation problems  No diet pills per patient No home 02 use per patient  No blood thinners per patient  Pt denies issues with constipation  No A fib or A flutter  EMMI video sent to pt's e mail  

## 2018-10-19 ENCOUNTER — Encounter: Payer: Self-pay | Admitting: Gastroenterology

## 2018-10-19 ENCOUNTER — Telehealth: Payer: Self-pay | Admitting: Gastroenterology

## 2018-10-19 NOTE — Telephone Encounter (Signed)
Pt sched for colon 2.7.20 with Dr. Silverio Decamp.  Pt stated that she has an ear piercing that she cannot remove until April.  Pt would like to know if this is OK since her instructions stated " no jewelry."

## 2018-10-19 NOTE — Telephone Encounter (Signed)
Left a message that she will not have to remove the ear piercing for her colon. Call back with any questions'  Candice [name]

## 2018-10-22 ENCOUNTER — Encounter: Payer: Self-pay | Admitting: Family Medicine

## 2018-10-30 ENCOUNTER — Encounter: Payer: Self-pay | Admitting: Gastroenterology

## 2018-10-30 ENCOUNTER — Ambulatory Visit (AMBULATORY_SURGERY_CENTER): Payer: BLUE CROSS/BLUE SHIELD | Admitting: Gastroenterology

## 2018-10-30 VITALS — BP 166/103 | HR 55 | Temp 97.8°F | Resp 16 | Ht 64.0 in | Wt 156.0 lb

## 2018-10-30 DIAGNOSIS — D127 Benign neoplasm of rectosigmoid junction: Secondary | ICD-10-CM | POA: Diagnosis not present

## 2018-10-30 DIAGNOSIS — Z1211 Encounter for screening for malignant neoplasm of colon: Secondary | ICD-10-CM | POA: Diagnosis not present

## 2018-10-30 MED ORDER — SODIUM CHLORIDE 0.9 % IV SOLN: 500.0000 mL | Freq: Once | INTRAVENOUS | Status: DC

## 2018-10-30 NOTE — Progress Notes (Signed)
Called to room to assist during endoscopic procedure.  Patient ID and intended procedure confirmed with present staff. Received instructions for my participation in the procedure from the performing physician.  

## 2018-10-30 NOTE — Patient Instructions (Signed)
Handout:  Polyps  YOU HAD AN ENDOSCOPIC PROCEDURE TODAY AT THE [facility]:   Refer to the procedure report that was given to you for any specific questions about what was found during the examination.  If the procedure report does not answer your questions, please call your gastroenterologist to clarify.  If you requested that your care partner not be given the details of your procedure findings, then the procedure report has been included in a sealed envelope for you to review at your convenience later.  YOU SHOULD EXPECT: Some feelings of bloating in the abdomen. Passage of more gas than usual.  Walking can help get rid of the air that was put into your GI tract during the procedure and reduce the bloating. If you had a lower endoscopy (such as a colonoscopy or flexible sigmoidoscopy) you may notice spotting of blood in your stool or on the toilet paper. If you underwent a bowel prep for your procedure, you may not have a normal bowel movement for a few days.  Please Note:  You might notice some irritation and congestion in your nose or some drainage.  This is from the oxygen used during your procedure.  There is no need for concern and it should clear up in a day or so.  SYMPTOMS TO REPORT IMMEDIATELY:   Following lower endoscopy (colonoscopy or flexible sigmoidoscopy):  Excessive amounts of blood in the stool  Significant tenderness or worsening of abdominal pains  Swelling of the abdomen that is new, acute  Fever of 100F or higher  For urgent or emergent issues, a gastroenterologist can be reached at any hour by calling [phone].   DIET:  We do recommend a small meal at first, but then you may proceed to your regular diet.  Drink plenty of fluids but you should avoid alcoholic beverages for 24 hours.  ACTIVITY:  You should plan to take it easy for the rest of today and you should NOT DRIVE or use heavy machinery until tomorrow (because of the sedation medicines used  during the test).    FOLLOW UP: Our staff will call the number listed on your records the next business day following your procedure to check on you and address any questions or concerns that you may have regarding the information given to you following your procedure. If we do not reach you, we will leave a message.  However, if you are feeling well and you are not experiencing any problems, there is no need to return our call.  We will assume that you have returned to your regular daily activities without incident.  If any biopsies were taken you will be contacted by phone or by letter within the next 1-3 weeks.  Please call us at [phone] if you have not heard about the biopsies in 3 weeks.    SIGNATURES/CONFIDENTIALITY: You and/or your care partner have signed paperwork which will be entered into your electronic medical record.  These signatures attest to the fact that that the information above on your After Visit Summary has been reviewed and is understood.  Full responsibility of the confidentiality of this discharge information lies with you and/or your care-partner. 

## 2018-10-30 NOTE — Progress Notes (Signed)
Pt's states no medical or surgical changes since previsit or office visit.  Pt. Had tooth extraction 2/6/20Pt's states no medical or surgical changes since previsit or office visit.

## 2018-10-30 NOTE — Op Note (Signed)
[place] Patient Name: Candice [name] Procedure Date: 10/30/2018 7:50 AM MRN: [id number] Endoscopist: Mauri Pole , MD Age: 53 Referring MD:  Date of Birth: 29-Mar-1966 Gender: Female Account #: [id number] Procedure:                Colonoscopy Indications:              Screening for colorectal malignant neoplasm Medicines:                Monitored Anesthesia Care Procedure:                Pre-Anesthesia Assessment:                           - Prior to the procedure, a History and Physical                            was performed, and patient medications and                            allergies were reviewed. The patient's tolerance of                            previous anesthesia was also reviewed. The risks                            and benefits of the procedure and the sedation                            options and risks were discussed with the patient.                            All questions were answered, and informed consent                            was obtained. Prior Anticoagulants: The patient has                            taken no previous anticoagulant or antiplatelet                            agents. ASA Grade Assessment: II - A patient with                            mild systemic disease. After reviewing the risks                            and benefits, the patient was deemed in                            satisfactory condition to undergo the procedure.                           After obtaining informed consent, the colonoscope  was passed under direct vision. Throughout the                            procedure, the patient's blood pressure, pulse, and                            oxygen saturations were monitored continuously. The                            Model PCF-[id number]) scope was introduced                            through the anus and advanced to the the cecum,   identified by appendiceal orifice and ileocecal                            valve. The colonoscopy was performed without                            difficulty. The patient tolerated the procedure                            well. The quality of the bowel preparation was                            excellent. The terminal ileum, ileocecal valve,                            appendiceal orifice, and rectum were photographed. Scope In: 8:09:24 AM Scope Out: 8:22:24 AM Scope Withdrawal Time: 0 hours 9 minutes 0 seconds  Total Procedure Duration: 0 hours 13 minutes 0 seconds  Findings:                 The perianal and digital rectal examinations were                            normal.                           Superficial erosion at ileocecal valve, otherwise                            terminal ileum appeared normal.                           A 18 mm polyp was found in the recto-sigmoid colon.                            The polyp was pedunculated. The polyp was removed                            with a hot snare. Resection and retrieval were                            complete.  Non-bleeding internal hemorrhoids were found during                            retroflexion. The hemorrhoids were small. Complications:            No immediate complications. Estimated Blood Loss:     Estimated blood loss was minimal. Impression:               - One 18 mm polyp at the recto-sigmoid colon,                            removed with a hot snare. Resected and retrieved.                           - Non-bleeding internal hemorrhoids. Recommendation:           - Patient has a contact number available for                            emergencies. The signs and symptoms of potential                            delayed complications were discussed with the                            patient. Return to normal activities tomorrow.                            Written discharge instructions were  provided to the                            patient.                           - Resume previous diet.                           - Continue present medications.                           - No aspirin, ibuprofen, naproxen, or other                            non-steroidal anti-inflammatory drugs for 2 weeks.                           - Await pathology results.                           - Repeat colonoscopy in 3 years for surveillance                            based on pathology results. Mauri Pole, MD 10/30/2018 8:29:39 AM This report has been signed electronically.

## 2018-10-30 NOTE — Progress Notes (Signed)
A and O x3. Report to RN. Tolerated MAC anesthesia well.

## 2018-11-02 ENCOUNTER — Telehealth: Payer: Self-pay

## 2018-11-02 NOTE — Telephone Encounter (Signed)
  Follow up Call-  Call back number 10/30/2018  Post procedure Call Back phone  # [phone]  Permission to leave phone message Yes  Some recent data might be hidden     Patient questions:  Do you have a fever, pain , or abdominal swelling? No. Pain Score  0 *  Have you tolerated food without any problems? Yes.    Have you been able to return to your normal activities? Yes.    Do you have any questions about your discharge instructions: Diet   No. Medications  No. Follow up visit  No.  Do you have questions or concerns about your Care? No.  Actions: * If pain score is 4 or above: No action needed, pain <4.

## 2018-11-10 ENCOUNTER — Ambulatory Visit: Payer: Self-pay

## 2018-11-11 LAB — HM PAP SMEAR

## 2018-11-11 LAB — HM MAMMOGRAPHY

## 2018-11-12 ENCOUNTER — Telehealth: Payer: Self-pay | Admitting: Family Medicine

## 2018-11-12 ENCOUNTER — Ambulatory Visit: Payer: Self-pay

## 2018-11-12 NOTE — Telephone Encounter (Signed)
Copied from [place] [id number]. Topic: Quick Communication - Rx Refill/Question >> Nov 12, 2018 10:54 AM Gustavus Messing wrote: Medication: would like a prescription for a yeast infection. Over the counter meds have not worked and it is because she was on an antibiotic   Has the patient contacted their pharmacy? No. (Agent: If no, request that the patient contact the pharmacy for the refill.) She does not have a prescription (Agent: If yes, when and what did the pharmacy advise?)  Preferred Pharmacy (with phone number or street name): [id number] [place], [place] - [address] [phone] (Phone) [phone] (Fax)    Agent: Please be advised that RX refills may take up to 3 business days. We ask that you follow-up with your pharmacy.

## 2018-11-12 NOTE — Telephone Encounter (Signed)
Pt. Reports she was started on PCN by her oral surgeon. Has symptoms of yeast infection - itching, white discharge.Her provider is not in the office. States she will contact her oral Psychologist, sport and exercise.

## 2018-11-12 NOTE — Telephone Encounter (Signed)
Message left for pt. To call back and discuss symptoms.

## 2018-11-30 ENCOUNTER — Encounter: Payer: Self-pay | Admitting: Family Medicine

## 2019-03-29 ENCOUNTER — Telehealth: Payer: Self-pay

## 2019-03-29 DIAGNOSIS — M25572 Pain in left ankle and joints of left foot: Secondary | ICD-10-CM

## 2019-03-29 NOTE — Telephone Encounter (Signed)
Copied from [place] [id number]. Topic: General - Other >> Mar 29, 2019 12:11 PM Leward Quan A wrote: Reason for CRM: Patient called to say that she have been experiencing pain in her left ankle about 2 weeks now. She states that she have been elevating and icing it but still no help. Asking for a call back with what to do next. Ph# [phone]

## 2019-03-30 NOTE — Telephone Encounter (Signed)
Dr. Deborra Medina please advise on next steps for pt.

## 2019-03-30 NOTE — Telephone Encounter (Signed)
This is tough to do virtually.  Can we get him in with sports med, like Dr. Charlann Boxer?

## 2019-03-30 NOTE — Telephone Encounter (Signed)
Spoke with pt she is agreeable to see Sports medicine. Referral placed.

## 2019-03-30 NOTE — Addendum Note (Signed)
Addended by: Janan Ridge on: 03/30/2019 11:07 AM   Modules accepted: Orders

## 2019-04-05 ENCOUNTER — Encounter: Payer: Self-pay | Admitting: Family Medicine

## 2019-04-06 ENCOUNTER — Encounter: Payer: Self-pay | Admitting: Gastroenterology

## 2019-04-06 ENCOUNTER — Telehealth: Payer: Self-pay

## 2019-04-06 NOTE — Telephone Encounter (Signed)
Questions for Screening COVID-19  Symptom onset: None  Travel or Contacts: None  During this illness, did/does the patient experience any of the following symptoms? Fever >100.59F []   Yes [x]   No []   Unknown Subjective fever (felt feverish) []   Yes [x]   No []   Unknown Chills []   Yes [x]   No []   Unknown Muscle aches (myalgia) []   Yes [x]   No []   Unknown Runny nose (rhinorrhea) []   Yes [x]   No []   Unknown Sore throat []   Yes [x]   No []   Unknown Cough (new onset or worsening of chronic cough) []   Yes [x]   No []   Unknown Shortness of breath (dyspnea) []   Yes [x]   No []   Unknown Nausea or vomiting []   Yes [x]   No []   Unknown Headache []   Yes [x]   No []   Unknown Abdominal pain  []   Yes [x]   No []   Unknown Diarrhea (?3 loose/looser than normal stools/24hr period) []   Yes [x]   No []   Unknown Other, specify:  Patient risk factors: Smoker? []   Current []   Former []   Never If female, currently pregnant? []   Yes []   No  Patient Active Problem List   Diagnosis Date Noted  . Well woman exam without gynecological exam 10/14/2018  . Occipital neuralgia of right side 09/05/2017    Plan:  []   High risk for COVID-19 with red flags go to ED (with CP, SOB, weak/lightheaded, or fever > 101.5). Call ahead.  []   High risk for COVID-19 but stable. Inform provider and coordinate time for [facility] visit.   []   No red flags but URI signs or symptoms okay for [facility] visit.

## 2019-04-07 ENCOUNTER — Other Ambulatory Visit: Payer: Self-pay

## 2019-04-07 ENCOUNTER — Ambulatory Visit: Payer: BC Managed Care – PPO | Admitting: Family Medicine

## 2019-04-07 ENCOUNTER — Encounter: Payer: Self-pay | Admitting: Family Medicine

## 2019-04-07 VITALS — BP 124/84 | HR 66 | Temp 97.6°F | Ht 64.5 in | Wt 162.2 lb

## 2019-04-07 DIAGNOSIS — M7741 Metatarsalgia, right foot: Secondary | ICD-10-CM

## 2019-04-07 DIAGNOSIS — M6788 Other specified disorders of synovium and tendon, other site: Secondary | ICD-10-CM

## 2019-04-07 MED ORDER — NITROGLYCERIN 0.2 MG/HR TD PT24: MEDICATED_PATCH | TRANSDERMAL | 2 refills | Status: DC

## 2019-04-07 NOTE — Progress Notes (Signed)
[place] T. Deborah Lazcano, MD Primary Care and [place] at [facility] [place], [address] Phone: [phone]  FAX: [phone]  Candice [name] - 53 y.o. female  MRN [id number]  Date of Birth: September 11, 1966  Visit Date: 04/07/2019  PCP: Lucille Passy, MD  Referred by: Lucille Passy, MD  Chief Complaint  Patient presents with  . Ankle Pain    left  . Foot Pain    right   Subjective:   Candice [name] is a 53 y.o. very pleasant female patient who presents with the following:  Left ankle and she runs and was going to [employer] and had some pain in the left ankle.  Blister popped and now has some pain in the area.   Now trouble to put some weight on it. This is all at the posterior of her heel.  Yesterday it got worse a lot when getting on the floor out of bed.   3 x 3 miles a week. Not doing this - she is unable to run at all now and is having pain with walking.   Right has pain at the ball on her foot.   On her feet all day long.  She does wear heels and constrictive shoes on the forefoot.   Blister was there  Achilles - 3 cm above  Latter part of April   Has taken gabapentin, but only rarely for nerve irritation.  Past Medical History, Surgical History, Social History, Family History, Problem List, Medications, and Allergies have been reviewed and updated if relevant.  Patient Active Problem List   Diagnosis Date Noted  . Well woman exam without gynecological exam 10/14/2018  . Occipital neuralgia of right side 09/05/2017    Past Medical History:  Diagnosis Date  . Anemia   . Chlamydia   . Eczema 04/29/2012  . HSV (herpes simplex virus) infection    1 and 2   . Tinea 04/29/2012    Past Surgical History:  Procedure Laterality Date  . BREAST SURGERY    . CESAREAN SECTION     Twice: 2001 & 2005  . WISDOM TOOTH EXTRACTION      Social History   Socioeconomic History  . Marital  status: Married    Spouse name: Not on file  . Number of children: 2  . Years of education: Not on file  . Highest education level: Some college, no degree  Occupational History  . Occupation: Medical illustrator    Comment: self employed  Social Needs  . Financial resource strain: Not on file  . Food insecurity    Worry: Not on file    Inability: Not on file  . Transportation needs    Medical: Not on file    Non-medical: Not on file  Tobacco Use  . Smoking status: Former Smoker    Types: Cigars    Quit date: 07/2011    Years since quitting: 7.7  . Smokeless tobacco: Never Used  . Tobacco comment: quit 1 pack/ week cigarettes in Nov 2012; occasional cigar now  Substance and Sexual Activity  . Alcohol use: Yes    Alcohol/week: 3.0 - 4.0 standard drinks    Types: 3 - 4 Glasses of wine per week  . Drug use: No  . Sexual activity: Yes    Birth control/protection: Condom  Lifestyle  . Physical activity    Days per week: Not on file    Minutes per  session: Not on file  . Stress: Not on file  Relationships  . Social Herbalist on phone: Not on file    Gets together: Not on file    Attends religious service: Not on file    Active member of club or organization: Not on file    Attends meetings of clubs or organizations: Not on file    Relationship status: Not on file  . Intimate partner violence    Fear of current or ex partner: Not on file    Emotionally abused: Not on file    Physically abused: Not on file    Forced sexual activity: Not on file  Other Topics Concern  . Not on file  Social History Narrative   Lives at home with spouse and 2 daughters   Right handed   Drinks 2 cups of coffee daily    Family History  Problem Relation Age of Onset  . Hypertension Mother   . Alzheimer's disease Mother   . Depression Mother   . Eczema Mother   . Stroke Mother   . Alcohol abuse Mother   . Parkinson's disease Father   . Stroke Father   . Hypertension Brother    . Depression Brother   . Tuberculosis Maternal Aunt   . Prostate cancer Maternal Uncle   . Lung cancer Maternal Uncle   . Colon cancer Neg Hx   . Colon polyps Neg Hx   . Esophageal cancer Neg Hx   . Rectal cancer Neg Hx   . Stomach cancer Neg Hx     No Known Allergies  Medication list reviewed and updated in full in [employer].  GEN: No fevers, chills. Nontoxic. Primarily MSK c/o today. MSK: Detailed in the HPI GI: tolerating PO intake without difficulty Neuro: No numbness, parasthesias, or tingling associated. Otherwise the pertinent positives of the ROS are noted above.   Objective:   BP 124/84   Pulse 66   Temp 97.6 F (36.4 C) (Skin)   Ht 5' 4.5" (1.638 m)   Wt 162 lb 4 oz (73.6 kg)   SpO2 99%   BMI 27.42 kg/m    GEN: Well-developed,well-nourished,in no acute distress; alert,appropriate and cooperative throughout examination HEENT: Normocephalic and atraumatic without obvious abnormalities. Ears, externally no deformities PULM: Breathing comfortably in no respiratory distress EXT: No clubbing, cyanosis, or edema PSYCH: Normally interactive. Cooperative during the interview. Pleasant. Friendly and conversant. Not anxious or depressed appearing. Normal, full affect.  Foot: L Echymosis: no Edema: no ROM: full LE B Gait: heel toe, mildly antalgic MT pain: no Callus pattern: none Lateral Mall: NT Medial Mall: NT Talus: NT Navicular: NT Cuboid: NT Calcaneous: NT Metatarsals: bunionette b, but no frank pain 5th MT: NT Phalanges: NT Achilles: PAINFUL TO PALPATE approx 2 1/2 cm proximal to the insertion Plantar Fascia: NT Fat Pad: NT Peroneals: NT Post Tib: NT Great Toe: Nml motion Ant Drawer: neg ATFL: NT CFL: NT Deltoid: NT Sensation: intact   Radiology: No results found.  Assessment and Plan:     ICD-10-CM   1. Achilles tendinosis of left lower extremity  M67.88   2. Metatarsalgia of right foot  M77.41    Secondary achilles  tendinopathy after blister, now quite tender.  No nodule.  Start Alfredsson's protocol with NTG patches.   Metatarsalgia from foot breakdown, minimal concern.  Rec wider toe box, avoiding high heels with constriction if possible.  F/u 8 weeks.  I appreciate the opportunity to  evaluate this very friendly patient. If you have any question regarding her care or prognosis, do not hesitate to ask.   Patient Instructions  Achilles Rehab  Start with calf raises seated: Raise and then slowly lower for about 10 seconds. (OK to assist with raising) When able do 1 leg at a time.  Start with 3 sets of 10 reps.  When you can, add weight to lap, like a bookbag or something like this with boks.    When able: Calf raises on a step First lower and then raise on 1 foot If this is painful lower on 1 foot but do the heel raise on both feet  Begin with 3 sets of 10 repetitions  Increase by 5 repetitions every 3 days  Goal is 3 sets of 30 repetitions  Do with both knee straight and knee at 20 degrees of flexion  If pain persists at 3 sets of 30 - add backpack with 5 lbs Increase by 5 lbs per week to max of 30 lbs    Nitroglycerin Protocol   Apply 1/4 nitroglycerin patch to affected area daily.  Change position of patch within the affected area every 24 hours.  You may experience a headache during the first 1-2 weeks of using the patch, these should subside.  If you experience headaches after beginning nitroglycerin patch treatment, you may take your preferred over the counter pain reliever.  Another side effect of the nitroglycerin patch is skin irritation or rash related to patch adhesive.  Please notify our office if you develop more severe headaches or rash, and stop the patch.  Tendon healing with nitroglycerin patch may require 12 to 24 weeks depending on the extent of injury.  Men should not use if taking Viagra, Cialis, or Levitra.   Do not use if you have migraines or rosacea.        Follow-up: Return in about 2 months (around 06/08/2019).  Meds ordered this encounter  Medications  . nitroGLYCERIN (NITRODUR - DOSED IN MG/24 HR) 0.2 mg/hr patch    Sig: Apply 1/4 patch to affected area as directed by MD and change every 24 hours.    Dispense:  30 patch    Refill:  2   No orders of the defined types were placed in this encounter.   Signed,  Maud Deed. Reigna Ruperto, MD   Outpatient Encounter Medications as of 04/07/2019  Medication Sig  . gabapentin (NEURONTIN) 300 MG capsule Take 1 capsule (300 mg total) by mouth 3 (three) times daily as needed.  . zolpidem (AMBIEN) 10 MG tablet Take 10 mg by mouth at bedtime as needed for sleep.  . nitroGLYCERIN (NITRODUR - DOSED IN MG/24 HR) 0.2 mg/hr patch Apply 1/4 patch to affected area as directed by MD and change every 24 hours.   No facility-administered encounter medications on file as of 04/07/2019.

## 2019-04-07 NOTE — Patient Instructions (Addendum)
Achilles Rehab  Start with calf raises seated: Raise and then slowly lower for about 10 seconds. (OK to assist with raising) When able do 1 leg at a time.  Start with 3 sets of 10 reps.  When you can, add weight to lap, like a bookbag or something like this with boks.    When able: Calf raises on a step First lower and then raise on 1 foot If this is painful lower on 1 foot but do the heel raise on both feet  Begin with 3 sets of 10 repetitions  Increase by 5 repetitions every 3 days  Goal is 3 sets of 30 repetitions  Do with both knee straight and knee at 20 degrees of flexion  If pain persists at 3 sets of 30 - add backpack with 5 lbs Increase by 5 lbs per week to max of 30 lbs    Nitroglycerin Protocol   Apply 1/4 nitroglycerin patch to affected area daily.  Change position of patch within the affected area every 24 hours.  You may experience a headache during the first 1-2 weeks of using the patch, these should subside.  If you experience headaches after beginning nitroglycerin patch treatment, you may take your preferred over the counter pain reliever.  Another side effect of the nitroglycerin patch is skin irritation or rash related to patch adhesive.  Please notify our office if you develop more severe headaches or rash, and stop the patch.  Tendon healing with nitroglycerin patch may require 12 to 24 weeks depending on the extent of injury.  Men should not use if taking Viagra, Cialis, or Levitra.   Do not use if you have migraines or rosacea.

## 2019-04-12 ENCOUNTER — Ambulatory Visit (AMBULATORY_SURGERY_CENTER): Payer: Self-pay | Admitting: *Deleted

## 2019-04-12 ENCOUNTER — Other Ambulatory Visit: Payer: Self-pay

## 2019-04-12 VITALS — Ht 65.0 in | Wt 160.0 lb

## 2019-04-12 DIAGNOSIS — Z86010 Personal history of colonic polyps: Secondary | ICD-10-CM

## 2019-04-12 NOTE — Progress Notes (Signed)
No egg or soy allergy known to patient  No issues with past sedation with any surgeries  or procedures, no intubation problems  No diet pills per patient No home 02 use per patient  No blood thinners per patient  Pt denies issues with constipation  No A fib or A flutter  EMMI video sent to pt's e mail   Pt verified name, DOB, address and insurance during PV today. Pt mailed instruction packet to included paper to complete and mail back to [facility] with addressed and stamped envelope,  copy of consent form to read and not return, and instructions.  PV completed over the phone. Pt encouraged to call with questions or issues  

## 2019-04-16 ENCOUNTER — Telehealth: Payer: Self-pay | Admitting: Gastroenterology

## 2019-04-16 NOTE — Telephone Encounter (Signed)

## 2019-04-19 ENCOUNTER — Ambulatory Visit (AMBULATORY_SURGERY_CENTER): Payer: BC Managed Care – PPO | Admitting: Gastroenterology

## 2019-04-19 ENCOUNTER — Other Ambulatory Visit: Payer: Self-pay

## 2019-04-19 ENCOUNTER — Encounter: Payer: Self-pay | Admitting: Gastroenterology

## 2019-04-19 VITALS — BP 156/92 | HR 49 | Temp 98.7°F | Resp 11 | Ht 64.0 in | Wt 162.0 lb

## 2019-04-19 DIAGNOSIS — D126 Benign neoplasm of colon, unspecified: Secondary | ICD-10-CM

## 2019-04-19 DIAGNOSIS — Z86010 Personal history of colonic polyps: Secondary | ICD-10-CM

## 2019-04-19 MED ORDER — SODIUM CHLORIDE 0.9 % IV SOLN: 500.0000 mL | Freq: Once | INTRAVENOUS | Status: DC

## 2019-04-19 NOTE — Progress Notes (Addendum)
[place], [place]

## 2019-04-19 NOTE — Patient Instructions (Signed)
YOU HAD AN ENDOSCOPIC PROCEDURE TODAY AT THE [facility]:   Refer to the procedure report that was given to you for any specific questions about what was found during the examination.  If the procedure report does not answer your questions, please call your gastroenterologist to clarify.  If you requested that your care partner not be given the details of your procedure findings, then the procedure report has been included in a sealed envelope for you to review at your convenience later.  YOU SHOULD EXPECT: Some feelings of bloating in the abdomen. Passage of more gas than usual.  Walking can help get rid of the air that was put into your GI tract during the procedure and reduce the bloating. If you had a lower endoscopy (such as a colonoscopy or flexible sigmoidoscopy) you may notice spotting of blood in your stool or on the toilet paper. If you underwent a bowel prep for your procedure, you may not have a normal bowel movement for a few days.  Please Note:  You might notice some irritation and congestion in your nose or some drainage.  This is from the oxygen used during your procedure.  There is no need for concern and it should clear up in a day or so.  SYMPTOMS TO REPORT IMMEDIATELY:   Following lower endoscopy (colonoscopy or flexible sigmoidoscopy):  Excessive amounts of blood in the stool  Significant tenderness or worsening of abdominal pains  Swelling of the abdomen that is new, acute  Fever of 100F or higher  For urgent or emergent issues, a gastroenterologist can be reached at any hour by calling [phone].   DIET:  We do recommend a small meal at first, but then you may proceed to your regular diet.  Drink plenty of fluids but you should avoid alcoholic beverages for 24 hours.  ACTIVITY:  You should plan to take it easy for the rest of today and you should NOT DRIVE or use heavy machinery until tomorrow (because of the sedation medicines used during the test).     FOLLOW UP: Our staff will call the number listed on your records 48-72 hours following your procedure to check on you and address any questions or concerns that you may have regarding the information given to you following your procedure. If we do not reach you, we will leave a message.  We will attempt to reach you two times.  During this call, we will ask if you have developed any symptoms of COVID 19. If you develop any symptoms (ie: fever, flu-like symptoms, shortness of breath, cough etc.) before then, please call [phone].  If you test positive for Covid 19 in the 2 weeks post procedure, please call and report this information to us.    If any biopsies were taken you will be contacted by phone or by letter within the next 1-3 weeks.  Please call us at [phone] if you have not heard about the biopsies in 3 weeks.    SIGNATURES/CONFIDENTIALITY: You and/or your care partner have signed paperwork which will be entered into your electronic medical record.  These signatures attest to the fact that that the information above on your After Visit Summary has been reviewed and is understood.  Full responsibility of the confidentiality of this discharge information lies with you and/or your care-partner. 

## 2019-04-19 NOTE — Progress Notes (Signed)
Report given to PACU, vss 

## 2019-04-19 NOTE — Op Note (Signed)
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 The patient's tolerance of                            previous anesthesia was also reviewed. The risks                            and benefits of the procedure and the sedation                            options and risks were discussed with the patient.                            All questions were answered, and informed consent                            was obtained. Prior Anticoagulants: The patient has                            taken no previous anticoagulant or antiplatelet                            agents. ASA Grade Assessment: I - A normal, healthy  patient. After reviewing the risks and benefits,                            the patient was deemed in satisfactory condition to                            undergo the procedure.                           After obtaining informed consent, the scope was                            passed under direct vision. The Colonoscope was                            introduced through the anus and advanced to the  the                            descending colon. The flexible sigmoidoscopy was                            accomplished without difficulty. The patient                            tolerated the procedure well. The quality of the                            bowel preparation was excellent. Scope In: 1:05:54 PM Scope Out: 1:10:56 PM Total Procedure Duration: 0 hours 5 minutes 2 seconds  Findings:                 The perianal and digital rectal examinations were                            normal.                           A 15 mm post polypectomy scar was found in the                            recto-sigmoid colon. The scar tissue was healthy in                            appearance. There was no evidence of the previous                            polyp.                           Non-bleeding internal hemorrhoids were found during                            retroflexion. The hemorrhoids were medium-sized.  The exam was otherwise without abnormality. Complications:            No immediate complications. Estimated Blood Loss:     Estimated blood loss was minimal. Impression:               - Post-polypectomy scar in the recto-sigmoid colon.                           - Non-bleeding internal hemorrhoids.                           - The examination was otherwise normal.                           - No specimens collected. Recommendation:           - Resume previous diet.                           - Continue present medications.                           - Perform a colonoscopy in 3 years for surveillance. Mauri Pole, MD 04/19/2019 1:18:49 PM This report has been signed electronically.

## 2019-04-21 ENCOUNTER — Telehealth: Payer: Self-pay

## 2019-04-21 NOTE — Telephone Encounter (Signed)
No answer, left message to call back later today, B.Velia Pamer RN. 

## 2019-04-21 NOTE — Telephone Encounter (Signed)
Pt returned call and left a message with answering service to pls call her again.

## 2019-04-21 NOTE — Telephone Encounter (Signed)
  Follow up Call-  Call back number 04/19/2019 10/30/2018  Post procedure Call Back phone  # [phone] cell [phone]  Permission to leave phone message Yes Yes  Some recent data might be hidden     Patient questions:  Do you have a fever, pain , or abdominal swelling? No. Pain Score  0 *  Have you tolerated food without any problems? Yes.    Have you been able to return to your normal activities? Yes.    Do you have any questions about your discharge instructions: Diet   no Medications  No. Follow up visit  No.  Do you have questions or concerns about your Care? No.  Actions: * If pain score is 4 or above: No action needed, pain <4.  1. Have you developed a fever since your procedure? no  2.   Have you had an respiratory symptoms (SOB or cough) since your procedure? no  3.   Have you tested positive for COVID 19 since your procedure no  4.   Have you had any family members/close contacts diagnosed with the COVID 19 since your procedure?  no   If yes to any of these questions please route to Joylene John, RN and Alphonsa Gin, Therapist, sports.

## 2019-04-29 ENCOUNTER — Other Ambulatory Visit: Payer: Self-pay | Admitting: Family Medicine

## 2019-04-29 NOTE — Telephone Encounter (Signed)
Decline  She should be using only 1/4 of a patch daily, so even 1 month script should equal 120 days?  Can you make sure she is using them correctly?

## 2019-04-30 ENCOUNTER — Telehealth: Payer: Self-pay | Admitting: Family Medicine

## 2019-04-30 ENCOUNTER — Encounter: Payer: Self-pay | Admitting: Family Medicine

## 2019-04-30 NOTE — Telephone Encounter (Signed)
Spoke with Candice [name].  She is using the nitroglycerin patches correctly.  [facility] just sent it over as automatic refill.  Refill denied.

## 2019-04-30 NOTE — Telephone Encounter (Signed)
Spoke with the pt--Dr. Deborra Medina is not the in the office but [name] is in today. Advise Candice [name] that we can place an order and the pt just needs to go to [facility] today before 3:45pm to get it tested. Pt stated she is not sure if she can go to (she wants to get everyone and go together). Pt decline the order that we will put in today.    Copied from [place] [phone]. Topic: General - Other >> Apr 30, 2019 11:09 AM Nils Flack wrote: Reason for CRM: pt requesting covid orders.  Pt has has had an exposure.  No symptoms  Please all [phone]

## 2019-06-22 ENCOUNTER — Other Ambulatory Visit: Payer: Self-pay

## 2019-06-22 ENCOUNTER — Ambulatory Visit (INDEPENDENT_AMBULATORY_CARE_PROVIDER_SITE_OTHER): Payer: BC Managed Care – PPO | Admitting: Behavioral Health

## 2019-06-22 ENCOUNTER — Encounter: Payer: Self-pay | Admitting: Family Medicine

## 2019-06-22 DIAGNOSIS — Z23 Encounter for immunization: Secondary | ICD-10-CM | POA: Diagnosis not present

## 2019-06-22 NOTE — Progress Notes (Signed)
Patient presents in clinic today for Influenza vaccination. IM injection was given in the right deltoid. Patient tolerated the injection well. No signs or symptoms of a reaction were noted prior to patient leaving the nurse visit.

## 2019-09-12 ENCOUNTER — Encounter: Payer: Self-pay | Admitting: Family Medicine

## 2019-09-14 DIAGNOSIS — K219 Gastro-esophageal reflux disease without esophagitis: Secondary | ICD-10-CM | POA: Insufficient documentation

## 2019-09-14 NOTE — Assessment & Plan Note (Addendum)
History: Gastrointestinal ROS: positive for - nausea without vomiting with alcohol, citrius esepcially.. Medication compliance: not currently on medications for this problem.  Has had acid come up in throat. Assessment/Plan: We reviewed the diagnosis of GERD and the reasons why it was important to treat this. We discussed "red flag" symptoms and the importance of follow up if symptoms persisted despite treatment. We reviewed non-pharmacologic management of GERD symptoms: including: caffeine reduction, dietary changes, elevate HOB, NPO after supper, reduction of alcohol intake, tobacco cessation, and weight loss.  She using Goody powders- was taking it two or three times a month. She will cut back on this and take with food (she usually takes on an empty stomach).  No black stools are  Changes in bowel habits.  [facility] sent for oemprazole along with a mychart message with recommendations.  She will follow up in 1 month.  The patient indicates understanding of these issues and agrees with the plan.\

## 2019-09-14 NOTE — Progress Notes (Signed)
Virtual Visit via Video   Due to the COVID-19 pandemic, this visit was completed with telemedicine (audio/video) technology to reduce patient and provider exposure as well as to preserve personal protective equipment.   I connected with Candice [name] by a video enabled telemedicine application and verified that I am speaking with the correct person using two identifiers. Location patient: Home Location provider: [facility], Office Persons participating in the virtual visit: Lauree Chandler, MD   I discussed the limitations of evaluation and management by telemedicine and the availability of in person appointments. The patient expressed understanding and agreed to proceed.  Interactive audio and video telecommunications were attempted between this provider and patient, however failed, due to patient having technical difficulties OR patient did not have access to video capability.  We continued and completed visit with audio only.  Care Team   Patient Care Team: Lucille Passy, MD as PCP - General (Family Medicine)  Subjective:   HPI:   Patient sent the following mychart message on 12'20'20:  'I have been experiencing "gastric" issues over a period of the last few months. With Covid, I want to avoid any office visits-if possible but I do want to have this examined at some point... alcohol, citrus, some normal food items just make me wanna puke. What would you recommend at this point?"   Review of Systems  Constitutional: Negative.  Negative for fever and malaise/fatigue.  HENT: Negative.  Negative for congestion and hearing loss.   Eyes: Negative.  Negative for blurred vision, discharge and redness.  Respiratory: Negative.  Negative for cough and shortness of breath.   Cardiovascular: Negative.  Negative for chest pain, palpitations and leg swelling.  Gastrointestinal: Positive for nausea. Negative for abdominal pain, blood in stool, constipation,  diarrhea, heartburn, melena and vomiting.  Genitourinary: Negative.  Negative for dysuria.  Musculoskeletal: Negative.  Negative for falls.  Skin: Negative.  Negative for rash.  Neurological: Negative.  Negative for loss of consciousness and headaches.  Endo/Heme/Allergies: Negative.  Does not bruise/bleed easily.  Psychiatric/Behavioral: Negative.  Negative for depression.  All other systems reviewed and are negative.    Patient Active Problem List   Diagnosis Date Noted  . GERD (gastroesophageal reflux disease) 09/14/2019  . Well woman exam without gynecological exam 10/14/2018  . Occipital neuralgia of right side 09/05/2017    Social History   Tobacco Use  . Smoking status: Former Smoker    Types: Cigars, Cigarettes    Quit date: 07/2011    Years since quitting: 8.1  . Smokeless tobacco: Never Used  . Tobacco comment: cigar july 4th,2020  Substance Use Topics  . Alcohol use: Yes    Alcohol/week: 3.0 - 4.0 standard drinks    Types: 3 - 4 Glasses of wine per week    Current Outpatient Medications:  .  cetirizine (ZYRTEC) 10 MG tablet, Take 10 mg by mouth daily as needed for allergies., Disp: , Rfl:  .  gabapentin (NEURONTIN) 300 MG capsule, Take 1 capsule (300 mg total) by mouth 3 (three) times daily as needed. (Patient not taking: Reported on 04/12/2019), Disp: 90 capsule, Rfl: 3 .  ibuprofen (ADVIL) 200 MG tablet, Take 200 mg by mouth every 6 (six) hours as needed., Disp: , Rfl:  .  nitroGLYCERIN (NITRODUR - DOSED IN MG/24 HR) 0.2 mg/hr patch, Apply 1/4 patch to affected area as directed by MD and change every 24 hours., Disp: 30 patch, Rfl: 2 .  zolpidem (AMBIEN) 10  MG tablet, Take 10 mg by mouth at bedtime as needed for sleep., Disp: , Rfl:   No Known Allergies  Objective:  There were no vitals taken for this visit.  VITALS: Per patient if applicable, see vitals. GENERAL: Alert, appears well and in no acute distress. HEENT: Atraumatic, conjunctiva clear, no obvious  abnormalities on inspection of external nose and ears. NECK: Normal movements of the head and neck. CARDIOPULMONARY: No increased WOB. Speaking in clear sentences. I:E ratio WNL.  MS: Moves all visible extremities without noticeable abnormality. PSYCH: Pleasant and cooperative, well-groomed. Speech normal rate and rhythm. Affect is appropriate. Insight and judgement are appropriate. Attention is focused, linear, and appropriate.  NEURO: CN grossly intact. Oriented as arrived to appointment on time with no prompting. Moves both UE equally.  SKIN: No obvious lesions, wounds, erythema, or cyanosis noted on face or hands.  Depression screen [facility] 2/9 10/14/2018  Decreased Interest 0  Down, Depressed, Hopeless 0  PHQ - 2 Score 0     . COVID-19 Education: The signs and symptoms of COVID-19 were discussed with the patient and how to seek care for testing if needed. The importance of social distancing was discussed today. . Reviewed expectations re: course of current medical issues. . Discussed self-management of symptoms. . Outlined signs and symptoms indicating need for more acute intervention. . Patient verbalized understanding and all questions were answered. [name] Kitchen Health Maintenance issues including appropriate healthy diet, exercise, and smoking avoidance were discussed with patient. . See orders for this visit as documented in the electronic medical record.  Arnette Norris, MD  Records requested if needed. Time spent: 25 minutes, of which >50% was spent in obtaining information about her symptoms, reviewing her previous labs, evaluations, and treatments, counseling her about her condition (please see the discussed topics above), and developing a plan to further investigate it; she had a number of questions which I addressed.   Lab Results  Component Value Date   WBC 5.9 10/14/2018   HGB 13.7 10/14/2018   HCT 40.7 10/14/2018   PLT 204.0 10/14/2018   GLUCOSE 92 10/14/2018   CHOL 216 (H)  10/14/2018   TRIG 76.0 10/14/2018   HDL 75.40 10/14/2018   LDLCALC 125 (H) 10/14/2018   ALT 22 10/14/2018   AST 21 10/14/2018   NA 138 10/14/2018   K 3.8 10/14/2018   CL 104 10/14/2018   CREATININE 0.67 10/14/2018   BUN 11 10/14/2018   CO2 26 10/14/2018   TSH 1.18 10/14/2018    Lab Results  Component Value Date   TSH 1.18 10/14/2018   Lab Results  Component Value Date   WBC 5.9 10/14/2018   HGB 13.7 10/14/2018   HCT 40.7 10/14/2018   MCV 93.9 10/14/2018   PLT 204.0 10/14/2018   Lab Results  Component Value Date   NA 138 10/14/2018   K 3.8 10/14/2018   CO2 26 10/14/2018   GLUCOSE 92 10/14/2018   BUN 11 10/14/2018   CREATININE 0.67 10/14/2018   BILITOT 1.5 (H) 10/14/2018   ALKPHOS 60 10/14/2018   AST 21 10/14/2018   ALT 22 10/14/2018   PROT 7.0 10/14/2018   ALBUMIN 4.5 10/14/2018   CALCIUM 9.6 10/14/2018   GFR 112.60 10/14/2018   Lab Results  Component Value Date   CHOL 216 (H) 10/14/2018   Lab Results  Component Value Date   HDL 75.40 10/14/2018   Lab Results  Component Value Date   LDLCALC 125 (H) 10/14/2018   Lab Results  Component Value Date   TRIG 76.0 10/14/2018   Lab Results  Component Value Date   CHOLHDL 3 10/14/2018   No results found for: HGBA1C     Assessment & Plan:   Problem List Items Addressed This Visit      Active Problems   GERD (gastroesophageal reflux disease)    History: Gastrointestinal ROS: positive for - nausea without vomiting with alcohol, citrius esepcially.. Medication compliance: not currently on medications for this problem. Assessment/Plan: We reviewed the diagnosis of GERD and the reasons why it was important to treat this. We discussed "red flag" symptoms and the importance of follow up if symptoms persisted despite treatment. We reviewed non-pharmacologic management of GERD symptoms: including: caffeine reduction, dietary changes, elevate HOB, NPO after supper, reduction of alcohol intake, tobacco cessation,  and weight loss.  She is taking Advil- advised to cut back on NSAIDs.  ?check Hpylori.           I am having Candice Petrin. [name] maintain her gabapentin, zolpidem, nitroGLYCERIN, ibuprofen, and cetirizine.  No orders of the defined types were placed in this encounter.    Arnette Norris, MD

## 2019-09-15 ENCOUNTER — Ambulatory Visit (INDEPENDENT_AMBULATORY_CARE_PROVIDER_SITE_OTHER): Payer: BC Managed Care – PPO | Admitting: Family Medicine

## 2019-09-15 ENCOUNTER — Encounter: Payer: Self-pay | Admitting: Family Medicine

## 2019-09-15 DIAGNOSIS — K219 Gastro-esophageal reflux disease without esophagitis: Secondary | ICD-10-CM

## 2019-09-15 MED ORDER — OMEPRAZOLE 40 MG PO CPDR: 40.0000 mg | DELAYED_RELEASE_CAPSULE | Freq: Every day | ORAL | 3 refills | Status: DC

## 2019-09-15 NOTE — Patient Instructions (Signed)
Reflux Disease, Adult When you have gastroesophageal reflux disease (GERD), the foods you eat and your eating habits are very important. Choosing the right foods can help ease your discomfort. Think about working with a nutrition specialist (dietitian) to help you make good choices. What are tips for following this plan?  Meals  Choose healthy foods that are low in fat, such as fruits, vegetables, whole grains, low-fat dairy products, and lean meat, fish, and poultry.  Eat small meals often instead of 3 large meals a day. Eat your meals slowly, and in a place where you are relaxed. Avoid bending over or lying down until 2-3 hours after eating.  Avoid eating meals 2-3 hours before bed.  Avoid drinking a lot of liquid with meals.  Cook foods using methods other than frying. Bake, grill, or broil food instead.  Avoid or limit: ? Chocolate. ? Peppermint or spearmint. ? Alcohol. ? Pepper. ? Black and decaffeinated coffee. ? Black and decaffeinated tea. ? Bubbly (carbonated) soft drinks. ? Caffeinated energy drinks and soft drinks.  Limit high-fat foods such as: ? Fatty meat or fried foods. ? Whole milk, cream, butter, or ice cream. ? Nuts and nut butters. ? Pastries, donuts, and sweets made with butter or shortening.  Avoid foods that cause symptoms. These foods may be different for everyone. Common foods that cause symptoms include: ? Tomatoes. ? Oranges, lemons, and limes. ? Peppers. ? Spicy food. ? Onions and garlic. ? Vinegar. Lifestyle  Maintain a healthy weight. Ask your doctor what weight is healthy for you. If you need to lose weight, work with your doctor to do so safely.  Exercise for at least 30 minutes for 5 or more days each week, or as told by your doctor.  Wear loose-fitting clothes.  Do not smoke. If you need help quitting, ask your doctor.  Sleep with the head of your bed higher than your feet. Use a wedge under the mattress or blocks under the bed frame  to raise the head of the bed. Summary  When you have gastroesophageal reflux disease (GERD), food and lifestyle choices are very important in easing your symptoms.  Eat small meals often instead of 3 large meals a day. Eat your meals slowly, and in a place where you are relaxed.  Limit high-fat foods such as fatty meat or fried foods.  Avoid bending over or lying down until 2-3 hours after eating.  Avoid peppermint and spearmint, caffeine, alcohol, and chocolate.

## 2019-12-08 ENCOUNTER — Other Ambulatory Visit: Payer: Self-pay

## 2019-12-08 MED ORDER — OMEPRAZOLE 40 MG PO CPDR: DELAYED_RELEASE_CAPSULE | ORAL | 0 refills | Status: DC

## 2019-12-24 ENCOUNTER — Other Ambulatory Visit: Payer: Self-pay | Admitting: Family Medicine

## 2019-12-27 NOTE — Telephone Encounter (Signed)
Last office visit with Dr. Lorelei Pont 04/07/2019 for achilles tendinosis of left lower extremity. Last refilled 04/07/2019 for #30 patch with 2 refills.  No future appointments.  Refill?

## 2020-03-05 ENCOUNTER — Other Ambulatory Visit: Payer: Self-pay | Admitting: Family Medicine

## 2020-03-14 ENCOUNTER — Other Ambulatory Visit: Payer: Self-pay

## 2020-03-16 ENCOUNTER — Other Ambulatory Visit: Payer: Self-pay

## 2020-03-16 ENCOUNTER — Encounter: Payer: Self-pay | Admitting: Family Medicine

## 2020-03-16 ENCOUNTER — Ambulatory Visit: Payer: BC Managed Care – PPO | Admitting: Family Medicine

## 2020-03-16 VITALS — BP 140/90 | HR 65 | Temp 97.4°F | Ht 64.0 in | Wt 167.6 lb

## 2020-03-16 DIAGNOSIS — K648 Other hemorrhoids: Secondary | ICD-10-CM | POA: Diagnosis not present

## 2020-03-16 MED ORDER — HYDROCORTISONE ACETATE 25 MG RE SUPP: 25.0000 mg | Freq: Two times a day (BID) | RECTAL | 0 refills | Status: AC

## 2020-03-16 NOTE — Progress Notes (Signed)
Candice [name] is a 54 y.o. female  Chief Complaint  Patient presents with  . Blood In Stools    Pt c/o blood in stool on Tuesday when she wiped and then again on Saturday.  Pt had a hx of hemorrhoids    HPI: Candice [name] is a 54 y.o. female who was formerly a patient of Dr. Deborra Medina and today complains of 2 episodes of blood on toilet paper when wiping after BM. No blood in the toilet. Pt has 3-4 BM per day at baseline and this is unchanged. No rectal pain. She has a h/o internal hemorrhoids.  She saw GYN for annual exam and was Rx'd Anusol (unsure of cream or suppository) which pt did not yet pick up.  Colonoscopy 03/2019   Past Medical History:  Diagnosis Date  . Allergy    seasonal  . Anemia   . Chlamydia   . Eczema 04/29/2012  . HSV (herpes simplex virus) infection    1 and 2   . Tinea 04/29/2012    Past Surgical History:  Procedure Laterality Date  . BREAST SURGERY    . CESAREAN SECTION     Twice: 2001 & 2005  . COLONOSCOPY    . POLYPECTOMY    . WISDOM TOOTH EXTRACTION      Social History   Socioeconomic History  . Marital status: Married    Spouse name: Not on file  . Number of children: 2  . Years of education: Not on file  . Highest education level: Some college, no degree  Occupational History  . Occupation: Medical illustrator    Comment: self employed  Tobacco Use  . Smoking status: Former Smoker    Types: Cigars, Cigarettes    Quit date: 07/2011    Years since quitting: 8.6  . Smokeless tobacco: Never Used  . Tobacco comment: cigar july 4th,2020  Vaping Use  . Vaping Use: Never used  Substance and Sexual Activity  . Alcohol use: Yes    Alcohol/week: 3.0 - 4.0 standard drinks    Types: 3 - 4 Glasses of wine per week  . Drug use: No  . Sexual activity: Yes    Birth control/protection: Condom  Other Topics Concern  . Not on file  Social History Narrative   Lives at home with spouse and 2 daughters   Right handed   Drinks 2 cups  of coffee daily   Social Determinants of Health   Financial Resource Strain:   . Difficulty of Paying Living Expenses:   Food Insecurity:   . Worried About Charity fundraiser in the Last Year:   . Arboriculturist in the Last Year:   Transportation Needs:   . Film/video editor (Medical):   [name] Kitchen Lack of Transportation (Non-Medical):   Physical Activity:   . Days of Exercise per Week:   . Minutes of Exercise per Session:   Stress:   . Feeling of Stress :   Social Connections:   . Frequency of Communication with Friends and Family:   . Frequency of Social Gatherings with Friends and Family:   . Attends Religious Services:   . Active Member of Clubs or Organizations:   . Attends Archivist Meetings:   [name] Kitchen Marital Status:   Intimate Partner Violence:   . Fear of Current or Ex-Partner:   . Emotionally Abused:   [name] Kitchen Physically Abused:   . Sexually Abused:     Family History  Problem Relation  Age of Onset  . Hypertension Mother   . Alzheimer's disease Mother   . Depression Mother   . Eczema Mother   . Stroke Mother   . Alcohol abuse Mother   . Parkinson's disease Father   . Stroke Father   . Hypertension Brother   . Depression Brother   . Tuberculosis Maternal Aunt   . Prostate cancer Maternal Uncle   . Lung cancer Maternal Uncle   . Colon polyps Brother   . Colon cancer Brother        pt 28-47 yrs old when dx with colon ca  . Esophageal cancer Neg Hx   . Rectal cancer Neg Hx   . Stomach cancer Neg Hx      Immunization History  Administered Date(s) Administered  . Influenza,inj,Quad PF,6+ Mos 06/22/2019  . Tdap 10/14/2018    Outpatient Encounter Medications as of 03/16/2020  Medication Sig Note  . hydrocortisone (ANUSOL-HC) 2.5 % rectal cream Apply topically.   [name] Kitchen ibuprofen (ADVIL) 200 MG tablet Take 200 mg by mouth every 6 (six) hours as needed.   . nitroGLYCERIN (NITRODUR - DOSED IN MG/24 HR) 0.2 mg/hr patch APPLY 1/4 PATCH TO AFFECTED AREA AS DIRECTED  BY MD AND CHANGE EVERY 24 HOURS.   [name] Kitchen omeprazole (PRILOSEC) 40 MG capsule TAKE 1 CAPSULE BY MOUTH DAILY (PLZ SCHED APPT WITH NEW PROVIDER)   . zolpidem (AMBIEN) 10 MG tablet Take 10 mg by mouth at bedtime as needed for sleep. 04/12/2019: Take 1/2 pill as needed  . cetirizine (ZYRTEC) 10 MG tablet Take 10 mg by mouth daily as needed for allergies. (Patient not taking: Reported on 03/16/2020)   . gabapentin (NEURONTIN) 300 MG capsule Take 1 capsule (300 mg total) by mouth 3 (three) times daily as needed. (Patient not taking: Reported on 03/16/2020)    No facility-administered encounter medications on file as of 03/16/2020.     ROS: Pertinent positives and negatives noted in HPI. Remainder of ROS non-contributory   No Known Allergies  BP 140/90 (BP Location: Left Arm, Patient Position: Sitting, Cuff Size: Normal)   Pulse 65   Temp (!) 97.4 F (36.3 C) (Temporal)   Ht 5\' 4"  (1.626 m)   Wt 167 lb 9.6 oz (76 kg)   SpO2 98%   BMI 28.77 kg/m   Physical Exam Vitals reviewed.  Constitutional:      General: She is not in acute distress.    Appearance: Normal appearance.  Genitourinary:    Comments: Rectal exam declined as pt had this done at GYN 2 days ago Neurological:     Mental Status: She is alert and oriented to person, place, and time.  Psychiatric:        Mood and Affect: Mood normal.        Behavior: Behavior normal.      A/P:  1. Internal hemorrhoids Rx: - hydrocortisone (ANUSOL-HC) 25 MG suppository; Place 1 suppository (25 mg total) rectally 2 (two) times daily for 10 days.  Dispense: 20 suppository; Refill: 0 - increase water and dietary fiber, regular exercise - f/u PRN   This visit occurred during the SARS-CoV-2 public health emergency.  Safety protocols were in place, including screening questions prior to the visit, additional usage of staff PPE, and extensive cleaning of exam room while observing appropriate contact time as indicated for disinfecting solutions.

## 2020-03-18 ENCOUNTER — Encounter: Payer: Self-pay | Admitting: Family Medicine

## 2020-04-01 ENCOUNTER — Other Ambulatory Visit: Payer: Self-pay | Admitting: Family Medicine

## 2020-04-05 MED ORDER — OMEPRAZOLE 40 MG PO CPDR: 40.0000 mg | DELAYED_RELEASE_CAPSULE | Freq: Every day | ORAL | 3 refills | Status: DC

## 2020-04-05 NOTE — Addendum Note (Signed)
Addended by: Ronnald Nian on: 04/05/2020 08:05 AM   Modules accepted: Orders

## 2020-05-24 ENCOUNTER — Other Ambulatory Visit: Payer: Self-pay

## 2020-05-25 ENCOUNTER — Encounter: Payer: Self-pay | Admitting: Family Medicine

## 2020-05-25 ENCOUNTER — Ambulatory Visit (INDEPENDENT_AMBULATORY_CARE_PROVIDER_SITE_OTHER): Payer: BC Managed Care – PPO | Admitting: Family Medicine

## 2020-05-25 VITALS — BP 118/80 | HR 65 | Temp 97.8°F | Ht 64.0 in | Wt 167.0 lb

## 2020-05-25 DIAGNOSIS — G4700 Insomnia, unspecified: Secondary | ICD-10-CM

## 2020-05-25 DIAGNOSIS — K219 Gastro-esophageal reflux disease without esophagitis: Secondary | ICD-10-CM | POA: Diagnosis not present

## 2020-05-25 DIAGNOSIS — Z0000 Encounter for general adult medical examination without abnormal findings: Secondary | ICD-10-CM

## 2020-05-25 DIAGNOSIS — Z1321 Encounter for screening for nutritional disorder: Secondary | ICD-10-CM

## 2020-05-25 DIAGNOSIS — M778 Other enthesopathies, not elsewhere classified: Secondary | ICD-10-CM

## 2020-05-25 LAB — BASIC METABOLIC PANEL
BUN: 17 mg/dL (ref 6–23)
CO2: 26 mEq/L (ref 19–32)
Calcium: 9.9 mg/dL (ref 8.4–10.5)
Chloride: 106 mEq/L (ref 96–112)
Creatinine, Ser: 0.86 mg/dL (ref 0.40–1.20)
GFR: 83.88 mL/min (ref >60.00)
Glucose, Bld: 75 mg/dL (ref 70–99)
Potassium: 3.9 mEq/L (ref 3.5–5.1)
Sodium: 140 mEq/L (ref 135–145)

## 2020-05-25 LAB — LIPID PANEL
Cholesterol: 260 mg/dL — ABNORMAL HIGH (ref 0–200)
HDL: 91.7 mg/dL (ref >39.00)
LDL Cholesterol: 157 mg/dL — ABNORMAL HIGH (ref 0–99)
NonHDL: 168.07
Total CHOL/HDL Ratio: 3
Triglycerides: 55 mg/dL (ref 0.0–149.0)
VLDL: 11 mg/dL (ref 0.0–40.0)

## 2020-05-25 LAB — AST: AST: 30 U/L (ref 0–37)

## 2020-05-25 LAB — ALT: ALT: 27 U/L (ref 0–35)

## 2020-05-25 LAB — VITAMIN D 25 HYDROXY (VIT D DEFICIENCY, FRACTURES): VITD: 27.48 ng/mL — ABNORMAL LOW (ref 30.00–100.00)

## 2020-05-25 MED ORDER — OMEPRAZOLE 40 MG PO CPDR: 40.0000 mg | DELAYED_RELEASE_CAPSULE | Freq: Every day | ORAL | 3 refills | Status: DC

## 2020-05-25 MED ORDER — ZOLPIDEM TARTRATE 10 MG PO TABS: 10.0000 mg | ORAL_TABLET | Freq: Every evening | ORAL | 2 refills | Status: DC | PRN

## 2020-05-25 NOTE — Progress Notes (Signed)
Candice [name] is a 54 y.o. female  Chief Complaint  Patient presents with  . Establish Care    TOC from [facility]-CPE-pt is not fasting had some tea//    HPI: Candice [name] is a 54 y.o. female who is previously a patient of Dr. Deborra Medina seen today for annual CPE, fasting labs. She did have ginger and tumeric tea this AM.   Last PAP:  UTD - GYN Naima Dillard at [facility] Last mammo: 02/2020 Last colonoscopy: flex sig in 03/2019 - due for colo in 03/2022 - Dr. Silverio Decamp with [facility]  Diet/Exercise: works out regularly, pt does skip meals Dental: UTD and due in 06/2020 for 83mo cleaning Vision: wears glasses, due for exam but no vision changes  Med refills needed today? [place]  Past Medical History:  Diagnosis Date  . Allergy    seasonal  . Anemia   . Chlamydia   . Eczema 04/29/2012  . HSV (herpes simplex virus) infection    1 and 2   . Tinea 04/29/2012    Past Surgical History:  Procedure Laterality Date  . BREAST SURGERY    . CESAREAN SECTION     Twice: 2001 & 2005  . COLONOSCOPY    . POLYPECTOMY    . WISDOM TOOTH EXTRACTION      Social History   Socioeconomic History  . Marital status: Married    Spouse name: Not on file  . Number of children: 2  . Years of education: Not on file  . Highest education level: Some college, no degree  Occupational History  . Occupation: Medical illustrator    Comment: self employed  Tobacco Use  . Smoking status: Former Smoker    Types: Cigars, Cigarettes    Quit date: 07/2011    Years since quitting: 8.8  . Smokeless tobacco: Never Used  . Tobacco comment: cigar july 4th,2020  Vaping Use  . Vaping Use: Never used  Substance and Sexual Activity  . Alcohol use: Yes    Alcohol/week: 3.0 - 4.0 standard drinks    Types: 3 - 4 Glasses of wine per week  . Drug use: No  . Sexual activity: Yes    Birth control/protection: Condom  Other Topics Concern  . Not on file  Social History Narrative   Lives at  home with spouse and 2 daughters   Right handed   Drinks 2 cups of coffee daily   Social Determinants of Health   Financial Resource Strain:   . Difficulty of Paying Living Expenses: Not on file  Food Insecurity:   . Worried About Charity fundraiser in the Last Year: Not on file  . Ran Out of Food in the Last Year: Not on file  Transportation Needs:   . Lack of Transportation (Medical): Not on file  . Lack of Transportation (Non-Medical): Not on file  Physical Activity:   . Days of Exercise per Week: Not on file  . Minutes of Exercise per Session: Not on file  Stress:   . Feeling of Stress : Not on file  Social Connections:   . Frequency of Communication with Friends and Family: Not on file  . Frequency of Social Gatherings with Friends and Family: Not on file  . Attends Religious Services: Not on file  . Active Member of Clubs or Organizations: Not on file  . Attends Archivist Meetings: Not on file  . Marital Status: Not on file  Intimate Partner Violence:   .  Fear of Current or Ex-Partner: Not on file  . Emotionally Abused: Not on file  . Physically Abused: Not on file  . Sexually Abused: Not on file    Family History  Problem Relation Age of Onset  . Hypertension Mother   . Alzheimer's disease Mother   . Depression Mother   . Eczema Mother   . Stroke Mother   . Alcohol abuse Mother   . Parkinson's disease Father   . Stroke Father   . Hypertension Brother   . Depression Brother   . Tuberculosis Maternal Aunt   . Prostate cancer Maternal Uncle   . Lung cancer Maternal Uncle   . Colon polyps Brother   . Colon cancer Brother        pt 79-47 yrs old when dx with colon ca  . Esophageal cancer Neg Hx   . Rectal cancer Neg Hx   . Stomach cancer Neg Hx      Immunization History  Administered Date(s) Administered  . Influenza,inj,Quad PF,6+ Mos 06/22/2019  . PFIZER SARS-COV-2 Vaccination 12/01/2019, 12/27/2019  . Tdap 10/14/2018    Outpatient  Encounter Medications as of 05/25/2020  Medication Sig Note  . ibuprofen (ADVIL) 200 MG tablet Take 200 mg by mouth every 6 (six) hours as needed.   . nitroGLYCERIN (NITRODUR - DOSED IN MG/24 HR) 0.2 mg/hr patch APPLY 1/4 PATCH TO AFFECTED AREA AS DIRECTED BY MD AND CHANGE EVERY 24 HOURS.   [name] Kitchen omeprazole (PRILOSEC) 40 MG capsule Take 1 capsule (40 mg total) by mouth daily.   [name] Kitchen zolpidem (AMBIEN) 10 MG tablet Take 10 mg by mouth at bedtime as needed for sleep. 04/12/2019: Take 1/2 pill as needed   No facility-administered encounter medications on file as of 05/25/2020.     ROS: Gen: no fever, chills  Skin: no rash, itching ENT: no ear pain, ear drainage, nasal congestion, rhinorrhea, sinus pressure, sore throat Eyes: no blurry vision, double vision Resp: no cough, wheeze,SOB CV: no CP, palpitations, LE edema,  GI: no heartburn, n/v/d/c, abd pain GU: no dysuria, urgency, frequency, hematuria MSK: pain at base of Rt thumb with movement x 1 year Neuro: no dizziness, headache, weakness, vertigo Psych: no depression, anxiety, insomnia   No Known Allergies  BP 118/80   Pulse 65   Temp 97.8 F (36.6 C) (Temporal)   Ht 5\' 4"  (1.626 m)   Wt 167 lb (75.8 kg)   SpO2 100%   BMI 28.67 kg/m   Physical Exam Constitutional:      General: She is not in acute distress.    Appearance: She is well-developed.  HENT:     Head: Normocephalic and atraumatic.     Right Ear: Tympanic membrane and ear canal normal.     Left Ear: Tympanic membrane and ear canal normal.     Nose: Nose normal.  Eyes:     Conjunctiva/sclera: Conjunctivae normal.     Pupils: Pupils are equal, round, and reactive to light.  Neck:     Thyroid: No thyromegaly.  Cardiovascular:     Rate and Rhythm: Normal rate and regular rhythm.     Heart sounds: Normal heart sounds. No murmur heard.   Pulmonary:     Effort: Pulmonary effort is normal. No respiratory distress.     Breath sounds: Normal breath sounds. No wheezing or  rhonchi.  Abdominal:     General: Bowel sounds are normal. There is no distension.     Palpations: Abdomen is soft. There is no  mass.     Tenderness: There is no abdominal tenderness.  Musculoskeletal:     Cervical back: Neck supple.  Lymphadenopathy:     Cervical: No cervical adenopathy.  Skin:    General: Skin is warm and dry.  Neurological:     Mental Status: She is alert and oriented to person, place, and time.     Motor: No abnormal muscle tone.     Coordination: Coordination normal.  Psychiatric:        Behavior: Behavior normal.      A/P:  1. Annual physical exam - UTD on PAP, mammo, colonoscopy - discussed importance of regular CV exercise, healthy diet, adequate sleep - UTD on dental exam, due for vision  - immunizations UTD, shingrix next year - ALT - AST - Basic metabolic panel - Lipid panel - VITAMIN D 25 Hydroxy (Vit-D Deficiency, Fractures) - next CPE in1 year  2. Gastroesophageal reflux disease, unspecified whether esophagitis present - controlled Refill: - omeprazole (PRILOSEC) 40 MG capsule; Take 1 capsule (40 mg total) by mouth daily.  Dispense: 90 capsule; Refill: 3  3. Encounter for vitamin deficiency screening - VITAMIN D 25 Hydroxy (Vit-D Deficiency, Fractures)  4. Insomnia, unspecified type - chronic, stable. Pt takes 1/2 to 1 tab qHS - database reviewed and appropriate Refill: - zolpidem (AMBIEN) 10 MG tablet; Take 1 tablet (10 mg total) by mouth at bedtime as needed for sleep.  Dispense: 30 tablet; Refill: 2  5. Tendinitis of thumb - symptoms x 1 year, pt requests referral  - Ambulatory referral to Sports Medicine   This visit occurred during the SARS-CoV-2 public health emergency.  Safety protocols were in place, including screening questions prior to the visit, additional usage of staff PPE, and extensive cleaning of exam room while observing appropriate contact time as indicated for disinfecting solutions.

## 2020-06-08 ENCOUNTER — Other Ambulatory Visit: Payer: Self-pay

## 2020-06-08 ENCOUNTER — Ambulatory Visit (INDEPENDENT_AMBULATORY_CARE_PROVIDER_SITE_OTHER): Payer: BC Managed Care – PPO | Admitting: Family Medicine

## 2020-06-08 ENCOUNTER — Ambulatory Visit: Payer: Self-pay

## 2020-06-08 ENCOUNTER — Encounter: Payer: Self-pay | Admitting: Family Medicine

## 2020-06-08 VITALS — BP 151/87 | HR 49 | Ht 65.0 in | Wt 165.0 lb

## 2020-06-08 DIAGNOSIS — M79644 Pain in right finger(s): Secondary | ICD-10-CM

## 2020-06-08 DIAGNOSIS — M659 Synovitis and tenosynovitis, unspecified: Secondary | ICD-10-CM

## 2020-06-08 DIAGNOSIS — G8929 Other chronic pain: Secondary | ICD-10-CM

## 2020-06-08 MED ORDER — PREDNISONE 5 MG PO TABS: ORAL_TABLET | ORAL | 0 refills | Status: DC

## 2020-06-08 MED ORDER — PENNSAID 2 % EX SOLN: 1.0000 "application " | Freq: Two times a day (BID) | CUTANEOUS | 2 refills | Status: DC

## 2020-06-08 NOTE — Progress Notes (Signed)
Candice [name] - 54 y.o. female MRN [id number]  Date of birth: Feb 26, 1966  SUBJECTIVE:  Including CC & ROS.  Chief Complaint  Patient presents with  . Hand Pain    right thumb    Candice [name] is a 54 y.o. female that is presenting with right thumb pain.  The pain has been ongoing for a few months now.  It is occurring at the base of the thumb.  She has to rub it on a regular basis to relieve it.  Does type a lot on a regular basis.  No injury or inciting event.  Has not tried anything to relieve the pain.   Review of Systems See HPI   HISTORY: Past Medical, Surgical, Social, and Family History Reviewed & Updated per EMR.   Pertinent Historical Findings include:  Past Medical History:  Diagnosis Date  . Allergy    seasonal  . Anemia   . Chlamydia   . Eczema 04/29/2012  . HSV (herpes simplex virus) infection    1 and 2   . Tinea 04/29/2012    Past Surgical History:  Procedure Laterality Date  . BREAST SURGERY    . CESAREAN SECTION     Twice: 2001 & 2005  . COLONOSCOPY    . POLYPECTOMY    . WISDOM TOOTH EXTRACTION      Family History  Problem Relation Age of Onset  . Hypertension Mother   . Alzheimer's disease Mother   . Depression Mother   . Eczema Mother   . Stroke Mother   . Alcohol abuse Mother   . Parkinson's disease Father   . Stroke Father   . Hypertension Brother   . Depression Brother   . Tuberculosis Maternal Aunt   . Prostate cancer Maternal Uncle   . Lung cancer Maternal Uncle   . Colon polyps Brother   . Colon cancer Brother        pt 50-47 yrs old when dx with colon ca  . Esophageal cancer Neg Hx   . Rectal cancer Neg Hx   . Stomach cancer Neg Hx     Social History   Socioeconomic History  . Marital status: Married    Spouse name: Not on file  . Number of children: 2  . Years of education: Not on file  . Highest education level: Some college, no degree  Occupational History  . Occupation: Medical illustrator    Comment:  self employed  Tobacco Use  . Smoking status: Former Smoker    Types: Cigars, Cigarettes    Quit date: 07/2011    Years since quitting: 8.8  . Smokeless tobacco: Never Used  . Tobacco comment: cigar july 4th,2020  Vaping Use  . Vaping Use: Never used  Substance and Sexual Activity  . Alcohol use: Yes    Alcohol/week: 3.0 - 4.0 standard drinks    Types: 3 - 4 Glasses of wine per week  . Drug use: No  . Sexual activity: Yes    Birth control/protection: Condom  Other Topics Concern  . Not on file  Social History Narrative   Lives at home with spouse and 2 daughters   Right handed   Drinks 2 cups of coffee daily   Social Determinants of Health   Financial Resource Strain:   . Difficulty of Paying Living Expenses: Not on file  Food Insecurity:   . Worried About Charity fundraiser in the Last Year: Not on file  . Ran Out of  Food in the Last Year: Not on file  Transportation Needs:   . Lack of Transportation (Medical): Not on file  . Lack of Transportation (Non-Medical): Not on file  Physical Activity:   . Days of Exercise per Week: Not on file  . Minutes of Exercise per Session: Not on file  Stress:   . Feeling of Stress : Not on file  Social Connections:   . Frequency of Communication with Friends and Family: Not on file  . Frequency of Social Gatherings with Friends and Family: Not on file  . Attends Religious Services: Not on file  . Active Member of Clubs or Organizations: Not on file  . Attends Archivist Meetings: Not on file  . Marital Status: Not on file  Intimate Partner Violence:   . Fear of Current or Ex-Partner: Not on file  . Emotionally Abused: Not on file  . Physically Abused: Not on file  . Sexually Abused: Not on file     PHYSICAL EXAM:  VS: BP (!) 151/87   Pulse (!) 49   Ht 5\' 5"  (1.651 m)   Wt 165 lb (74.8 kg)   BMI 27.46 kg/m  Physical Exam Gen: NAD, alert, cooperative with exam, well-appearing MSK:  Right thumb: Some bossing  of the interphalangeal joint. No change at the [facility] joint. Normal range of motion. Normal strength resistance. No instability. Neurovascularly intact  Limited ultrasound: Right thumb:  No significant color changes at the [facility] joint. There appears to be an inflammatory changes of the synovium at the interphalangeal joint to suggest a synovitis. There is increased hyperemia of the thenar eminence as well.  Summary: Findings suggest synovitis of the interphalangeal joint.  Ultrasound and interpretation by Clearance Coots, MD    ASSESSMENT & PLAN:   Synovitis of finger There is increased inflammatory change on the right interphalangeal joint and thenar eminence when compared to contralateral side.  No significant degenerative changes. -Counseled supportive care. -Prednisone. -Could consider lab work or injection if ongoing.

## 2020-06-08 NOTE — Assessment & Plan Note (Signed)
There is increased inflammatory change on the right interphalangeal joint and thenar eminence when compared to contralateral side.  No significant degenerative changes. -Counseled supportive care. -Prednisone. -Could consider lab work or injection if ongoing.

## 2020-06-08 NOTE — Patient Instructions (Signed)
Nice to meet you Please try the prednisone   Please try the rub on medicine after the prednisone  Please send me a message in [place] with any questions or updates.  Please see me back in 3 weeks.   --Dr. Raeford Razor

## 2020-06-28 ENCOUNTER — Ambulatory Visit (INDEPENDENT_AMBULATORY_CARE_PROVIDER_SITE_OTHER): Payer: BC Managed Care – PPO | Admitting: Family Medicine

## 2020-06-28 ENCOUNTER — Encounter: Payer: Self-pay | Admitting: Family Medicine

## 2020-06-28 ENCOUNTER — Other Ambulatory Visit: Payer: Self-pay

## 2020-06-28 VITALS — Ht 65.0 in | Wt 160.0 lb

## 2020-06-28 DIAGNOSIS — M659 Synovitis and tenosynovitis, unspecified: Secondary | ICD-10-CM

## 2020-06-28 NOTE — Progress Notes (Signed)
Candice [name] - 54 y.o. female MRN [id number]  Date of birth: 12/09/1965  SUBJECTIVE:  Including CC & ROS.  Chief Complaint  Patient presents with  . Follow-up    right thumb    Candice [name] is a 54 y.o. female that is presenting with ongoing right flank pain.  She did get some relief with the prednisone.  Has been using topical application with some improvement as well.  Still notices the pain intermittently.   Review of Systems See HPI   HISTORY: Past Medical, Surgical, Social, and Family History Reviewed & Updated per EMR.   Pertinent Historical Findings include:  Past Medical History:  Diagnosis Date  . Allergy    seasonal  . Anemia   . Chlamydia   . Eczema 04/29/2012  . HSV (herpes simplex virus) infection    1 and 2   . Tinea 04/29/2012    Past Surgical History:  Procedure Laterality Date  . BREAST SURGERY    . CESAREAN SECTION     Twice: 2001 & 2005  . COLONOSCOPY    . POLYPECTOMY    . WISDOM TOOTH EXTRACTION      Family History  Problem Relation Age of Onset  . Hypertension Mother   . Alzheimer's disease Mother   . Depression Mother   . Eczema Mother   . Stroke Mother   . Alcohol abuse Mother   . Parkinson's disease Father   . Stroke Father   . Hypertension Brother   . Depression Brother   . Tuberculosis Maternal Aunt   . Prostate cancer Maternal Uncle   . Lung cancer Maternal Uncle   . Colon polyps Brother   . Colon cancer Brother        pt 2-47 yrs old when dx with colon ca  . Esophageal cancer Neg Hx   . Rectal cancer Neg Hx   . Stomach cancer Neg Hx     Social History   Socioeconomic History  . Marital status: Married    Spouse name: Not on file  . Number of children: 2  . Years of education: Not on file  . Highest education level: Some college, no degree  Occupational History  . Occupation: Medical illustrator    Comment: self employed  Tobacco Use  . Smoking status: Former Smoker    Types: Cigars, Cigarettes     Quit date: 07/2011    Years since quitting: 8.9  . Smokeless tobacco: Never Used  . Tobacco comment: cigar july 4th,2020  Vaping Use  . Vaping Use: Never used  Substance and Sexual Activity  . Alcohol use: Yes    Alcohol/week: 3.0 - 4.0 standard drinks    Types: 3 - 4 Glasses of wine per week  . Drug use: No  . Sexual activity: Yes    Birth control/protection: Condom  Other Topics Concern  . Not on file  Social History Narrative   Lives at home with spouse and 2 daughters   Right handed   Drinks 2 cups of coffee daily   Social Determinants of Health   Financial Resource Strain:   . Difficulty of Paying Living Expenses: Not on file  Food Insecurity:   . Worried About Charity fundraiser in the Last Year: Not on file  . Ran Out of Food in the Last Year: Not on file  Transportation Needs:   . Lack of Transportation (Medical): Not on file  . Lack of Transportation (Non-Medical): Not on file  Physical  Activity:   . Days of Exercise per Week: Not on file  . Minutes of Exercise per Session: Not on file  Stress:   . Feeling of Stress : Not on file  Social Connections:   . Frequency of Communication with Friends and Family: Not on file  . Frequency of Social Gatherings with Friends and Family: Not on file  . Attends Religious Services: Not on file  . Active Member of Clubs or Organizations: Not on file  . Attends Archivist Meetings: Not on file  . Marital Status: Not on file  Intimate Partner Violence:   . Fear of Current or Ex-Partner: Not on file  . Emotionally Abused: Not on file  . Physically Abused: Not on file  . Sexually Abused: Not on file     PHYSICAL EXAM:  VS: Ht 5\' 5"  (1.651 m)   Wt 160 lb (72.6 kg)   BMI 26.63 kg/m  Physical Exam Gen: NAD, alert, cooperative with exam, well-appearing MSK:  Right thumb: Some bossing of the [facility] joint. Normal range of motion.  No swelling or redness. Neurovascularly intact     ASSESSMENT & PLAN:    Synovitis of finger Did get improvement with the steroid use.  She does notice it when she does push-ups as well as [place] so it may have a component to her workout regimen. -CRP, sed rate, ANA panel. -Brace. -Modifications with workouts. -Could consider injection.

## 2020-06-28 NOTE — Assessment & Plan Note (Signed)
Did get improvement with the steroid use.  She does notice it when she does push-ups as well as [place] so it may have a component to her workout regimen. -CRP, sed rate, ANA panel. -Brace. -Modifications with workouts. -Could consider injection.

## 2020-06-28 NOTE — Patient Instructions (Signed)
Good to see you Please try to modify your work out  Please try the brace  I will call with the results from today   Please send me a message in [place] with any questions or updates.  Please see me back in 4 weeks or as needed if better.   --Dr. Raeford Razor

## 2020-06-29 ENCOUNTER — Encounter: Payer: Self-pay | Admitting: Family Medicine

## 2020-07-06 ENCOUNTER — Telehealth: Payer: Self-pay | Admitting: Family Medicine

## 2020-07-06 NOTE — Telephone Encounter (Signed)
Called pt to advise Lab order available for pick up.  -glh

## 2020-11-23 ENCOUNTER — Other Ambulatory Visit: Payer: Self-pay | Admitting: Family Medicine

## 2020-11-23 DIAGNOSIS — G4700 Insomnia, unspecified: Secondary | ICD-10-CM

## 2021-01-31 ENCOUNTER — Other Ambulatory Visit: Payer: Self-pay

## 2021-02-01 ENCOUNTER — Ambulatory Visit: Payer: BC Managed Care – PPO | Admitting: Family Medicine

## 2021-02-01 ENCOUNTER — Encounter: Payer: Self-pay | Admitting: Family Medicine

## 2021-02-01 VITALS — BP 132/80 | HR 61 | Temp 98.0°F | Ht 65.0 in | Wt 174.0 lb

## 2021-02-01 DIAGNOSIS — K648 Other hemorrhoids: Secondary | ICD-10-CM

## 2021-02-01 DIAGNOSIS — K625 Hemorrhage of anus and rectum: Secondary | ICD-10-CM | POA: Diagnosis not present

## 2021-02-01 DIAGNOSIS — R194 Change in bowel habit: Secondary | ICD-10-CM | POA: Diagnosis not present

## 2021-02-01 MED ORDER — POLYETHYLENE GLYCOL 3350 17 GM/SCOOP PO POWD: 17.0000 g | Freq: Two times a day (BID) | ORAL | 1 refills | Status: DC | PRN

## 2021-02-01 MED ORDER — DOCUSATE SODIUM 100 MG PO CAPS: 100.0000 mg | ORAL_CAPSULE | Freq: Two times a day (BID) | ORAL | 2 refills | Status: DC

## 2021-02-01 NOTE — Patient Instructions (Signed)
miralax 1 capful in 4-6oz pf water 2x/day Colace 100mg  1 capsule 2x/day Goal is 1 soft, east to pass BM per day Then stop colace and continue miralax daily - can decrease to 1/2 cal daily or 1 cap every other day. Goal is to continue with regular, soft, BM on daily basis  GI referral placed today

## 2021-02-01 NOTE — Progress Notes (Signed)
Candice [name] is a 55 y.o. female  Chief Complaint  [name] presents with  . Blood In Stools    Pt c/o seeing blood more frequently.    HPI: Candice [name] is a 56 y.o. female [name] who complains of more frequent episodes of BRBPR and larger amount of blood when present. She also notes having issues with constipation about 5-6 mo. Prior to that she was having 3BM per day. She has been using dulcolax PRN - usually 2x/wk, less in the past month. She is sitting on the toilet for an hour or so at night. She is eating more fiber.  I saw the [name] in 02/2020 for same/similar symptoms She is UTD on colonoscopy and due in 03/2022. Colonoscopy in 03/2019 - internal hemorrhoids   Past Medical History:  Diagnosis Date  . Allergy    seasonal  . Anemia   . Chlamydia   . Eczema 04/29/2012  . HSV (herpes simplex virus) infection    1 and 2   . Tinea 04/29/2012    Past Surgical History:  Procedure Laterality Date  . BREAST SURGERY    . CESAREAN SECTION     Twice: 2001 & 2005  . COLONOSCOPY    . POLYPECTOMY    . WISDOM TOOTH EXTRACTION      Social History   Socioeconomic History  . Marital status: Married    Spouse name: Not on file  . Number of children: 2  . Years of education: Not on file  . Highest education level: Some college, no degree  Occupational History  . Occupation: Medical illustrator    Comment: self employed  Tobacco Use  . Smoking status: Former Smoker    Types: Cigars, Cigarettes    Quit date: 07/2011    Years since quitting: 9.5  . Smokeless tobacco: Never Used  . Tobacco comment: cigar july 4th,2020  Vaping Use  . Vaping Use: Never used  Substance and Sexual Activity  . Alcohol use: Yes    Alcohol/week: 3.0 - 4.0 standard drinks    Types: 3 - 4 Glasses of wine per week  . Drug use: No  . Sexual activity: Yes    Birth control/protection: Condom  Other Topics Concern  . Not on file  Social History Narrative   Lives at home with spouse  and 2 daughters   Right handed   Drinks 2 cups of coffee daily   Social Determinants of Health   Financial Resource Strain: Not on file  Food Insecurity: Not on file  Transportation Needs: Not on file  Physical Activity: Not on file  Stress: Not on file  Social Connections: Not on file  Intimate Partner Violence: Not on file    Family History  Problem Relation Age of Onset  . Hypertension Mother   . Alzheimer's disease Mother   . Depression Mother   . Eczema Mother   . Stroke Mother   . Alcohol abuse Mother   . Parkinson's disease Father   . Stroke Father   . Hypertension Brother   . Depression Brother   . Tuberculosis Maternal Aunt   . Prostate cancer Maternal Uncle   . Lung cancer Maternal Uncle   . Colon polyps Brother   . Colon cancer Brother        pt 5-47 yrs old when dx with colon ca  . Esophageal cancer Neg Hx   . Rectal cancer Neg Hx   . Stomach cancer Neg Hx  Immunization History  Administered Date(s) Administered  . Influenza,inj,Quad PF,6+ Mos 06/22/2019  . PFIZER(Purple Top)SARS-COV-2 Vaccination 12/01/2019, 12/27/2019  . Tdap 10/14/2018    Outpatient Encounter Medications as of 02/01/2021  Medication Sig  . docusate sodium (COLACE) 100 MG capsule Take 1 capsule (100 mg total) by mouth 2 (two) times daily.  [name] Kitchen ibuprofen (ADVIL) 200 MG tablet Take 200 mg by mouth every 6 (six) hours as needed.  . nitroGLYCERIN (NITRODUR - DOSED IN MG/24 HR) 0.2 mg/hr patch APPLY 1/4 PATCH TO AFFECTED AREA AS DIRECTED BY MD AND CHANGE EVERY 24 HOURS.  [name] Kitchen omeprazole (PRILOSEC) 40 MG capsule Take 1 capsule (40 mg total) by mouth daily.  . polyethylene glycol powder (GLYCOLAX/MIRALAX) 17 GM/SCOOP powder Take 17 g by mouth 2 (two) times daily as needed.  . zolpidem (AMBIEN) 10 MG tablet TAKE 1 TABLET(10 MG) BY MOUTH AT BEDTIME AS NEEDED FOR SLEEP  . Diclofenac Sodium (PENNSAID) 2 % SOLN Place 1 application onto the skin 2 (two) times daily. ([name] not taking: Reported  on 02/01/2021)  . predniSONE (DELTASONE) 5 MG tablet Take 6 pills for first day, 5 pills second day, 4 pills third day, 3 pills fourth day, 2 pills the fifth day, and 1 pill sixth day. ([name] not taking: Reported on 02/01/2021)   No facility-administered encounter medications on file as of 02/01/2021.     ROS: Pertinent positives and negatives noted in HPI. Remainder of ROS non-contributory   No Known Allergies  BP 132/80   Pulse 61   Temp 98 F (36.7 C) (Temporal)   Ht 5\' 5"  (1.651 m)   Wt 174 lb (78.9 kg)   LMP 08/17/2020   SpO2 99%   BMI 28.96 kg/m   Wt Readings from Last 3 Encounters:  02/01/21 174 lb (78.9 kg)  06/28/20 160 lb (72.6 kg)  06/08/20 165 lb (74.8 kg)   Temp Readings from Last 3 Encounters:  02/01/21 98 F (36.7 C) (Temporal)  05/25/20 97.8 F (36.6 C) (Temporal)  03/16/20 (!) 97.4 F (36.3 C) (Temporal)   BP Readings from Last 3 Encounters:  02/01/21 132/80  06/08/20 (!) 151/87  05/25/20 118/80   Pulse Readings from Last 3 Encounters:  02/01/21 61  06/08/20 (!) 49  05/25/20 65     Physical Exam Constitutional:      General: She is not in acute distress.    Appearance: Normal appearance. She is not ill-appearing.  Abdominal:     General: Bowel sounds are normal. There is no distension.     Palpations: Abdomen is soft.     Tenderness: There is no abdominal tenderness. There is no guarding or rebound.     Comments: Rectal exam deferred today as this is not a new issue for pt and I have done rectal exam for same symptoms in the past  Neurological:     Mental Status: She is alert and oriented to person, place, and time.  Psychiatric:        Mood and Affect: Mood normal.        Behavior: Behavior normal.      A/P:  1. Internal hemorrhoids 2. Bright red blood per rectum 3. Change in bowel habits - documented internal hemorrhoids on colonoscopy in 03/2022 - Ambulatory referral to Gastroenterology - would like pt to use miralax and colace  BID to clean out and then cut out colace and cont with 1 cap miralax daily and titrate to goal of 1 soft, easy to pass BM per day  Rx: - docusate sodium (COLACE) 100 MG capsule; Take 1 capsule (100 mg total) by mouth 2 (two) times daily.  Dispense: 60 capsule; Refill: 2 - polyethylene glycol powder (GLYCOLAX/MIRALAX) 17 GM/SCOOP powder; Take 17 g by mouth 2 (two) times daily as needed.  Dispense: 850 g; Refill: 1 - pt began having constipation about 5-6 mo ago and prior to that had 1+ BM per day  This visit occurred during the SARS-CoV-2 public health emergency.  Safety protocols were in place, including screening questions prior to the visit, additional usage of staff PPE, and extensive cleaning of exam room while observing appropriate contact time as indicated for disinfecting solutions.

## 2021-02-12 ENCOUNTER — Other Ambulatory Visit: Payer: Self-pay

## 2021-02-12 ENCOUNTER — Encounter: Payer: Self-pay | Admitting: Gastroenterology

## 2021-02-12 ENCOUNTER — Ambulatory Visit (INDEPENDENT_AMBULATORY_CARE_PROVIDER_SITE_OTHER): Payer: BC Managed Care – PPO | Admitting: Gastroenterology

## 2021-02-12 VITALS — BP 150/78 | HR 60 | Ht 65.0 in | Wt 170.4 lb

## 2021-02-12 DIAGNOSIS — K602 Anal fissure, unspecified: Secondary | ICD-10-CM

## 2021-02-12 DIAGNOSIS — K6289 Other specified diseases of anus and rectum: Secondary | ICD-10-CM

## 2021-02-12 DIAGNOSIS — K625 Hemorrhage of anus and rectum: Secondary | ICD-10-CM | POA: Diagnosis not present

## 2021-02-12 DIAGNOSIS — Z86010 Personal history of colonic polyps: Secondary | ICD-10-CM | POA: Diagnosis not present

## 2021-02-12 MED ORDER — AMBULATORY NON FORMULARY MEDICATION: 1 refills | Status: DC

## 2021-02-12 NOTE — Patient Instructions (Signed)
We have sent a prescription for nitroglycerin 0.125% gel to [facility]. You should apply a pea size amount to your rectum three times daily x 6-8 weeks.  [facility]'s information is below: Address: [address] [place],  [address]  [phone]  *Please DO NOT go directly from our office to pick up this medication! Give the pharmacy 1 day to process the prescription as this is compounded and takes time to make.  Use Benefiber 1 tablespoon twice a day with meals   Due to recent changes in healthcare laws, you may see the results of your imaging and laboratory studies on MyChart before your provider has had a chance to review them.  We understand that in some cases there may be results that are confusing or concerning to you. Not all laboratory results come back in the same time frame and the provider may be waiting for multiple results in order to interpret others.  Please give [place] 48 hours in order for your provider to thoroughly review all the results before contacting the office for clarification of your results.   I appreciate the  opportunity to care for you  Thank You   Harl Bowie , MD

## 2021-02-12 NOTE — Progress Notes (Signed)
Candice [name]    [id number]    June 25, 1966  Primary Care Physician:Cirigliano, Garvin Fila, DO  Referring Physician: Ronnald Nian, DO [place],  [place] [address]   Chief complaint:  Rectal bleeding, rectal discomfort  HPI:  55 year old very pleasant female here for follow-up visit with complaints of rectal bleeding and discomfort She has been having on and off bright red blood per rectum since October 2021, was prescribed suppositories per rectum by PMD, her insurance did not cover the prescription.  She used over-the-counter suppositories with some improvement. She feels the rectal bleeding has increased since February 2022 and she is also having rectal discomfort with increased pressure in the rectum.  She has also developed constipation.  She is using Dulcolax as needed.  She has stopped using Dulcolax or hemorrhoidal suppositories, is worried that she may be becoming dependent on them.  Denies any melena or abdominal pain.  No nausea, vomiting, decreased appetite or weight loss.   Colonoscopy 10/30/2018 - The perianal and digital rectal examinations were normal. Superficial erosion at ileocecal valve, otherwise terminal ileum appeared normal. - A 18 mm polyp was found in the recto-sigmoid colon. The polyp was pedunculated. The polyp was removed with a hot snare. Resection and retrieval were complete. - Non-bleeding internal hemorrhoids were found during retroflexion. The hemorrhoids were small.  Colonoscopy 04/19/2019 - Post-polypectomy scar in the recto-sigmoid colon. - Non-bleeding internal hemorrhoids. - The examination was otherwise normal.  Outpatient Encounter Medications as of 02/12/2021  Medication Sig  . Diclofenac Sodium (PENNSAID) 2 % SOLN Place 1 application onto the skin 2 (two) times daily.  [name] Kitchen docusate sodium (COLACE) 100 MG capsule Take 1 capsule (100 mg total) by mouth 2 (two) times daily.  [name] Kitchen ibuprofen (ADVIL) 200  MG tablet Take 200 mg by mouth every 6 (six) hours as needed.  . nitroGLYCERIN (NITRODUR - DOSED IN MG/24 HR) 0.2 mg/hr patch APPLY 1/4 PATCH TO AFFECTED AREA AS DIRECTED BY MD AND CHANGE EVERY 24 HOURS.  [name] Kitchen omeprazole (PRILOSEC) 40 MG capsule Take 1 capsule (40 mg total) by mouth daily.  . polyethylene glycol powder (GLYCOLAX/MIRALAX) 17 GM/SCOOP powder Take 17 g by mouth 2 (two) times daily as needed.  . zolpidem (AMBIEN) 10 MG tablet TAKE 1 TABLET(10 MG) BY MOUTH AT BEDTIME AS NEEDED FOR SLEEP  . [DISCONTINUED] predniSONE (DELTASONE) 5 MG tablet Take 6 pills for first day, 5 pills second day, 4 pills third day, 3 pills fourth day, 2 pills the fifth day, and 1 pill sixth day.   No facility-administered encounter medications on file as of 02/12/2021.    Allergies as of 02/12/2021  . (No Known Allergies)    Past Medical History:  Diagnosis Date  . Allergy    seasonal  . Anemia   . Chlamydia   . Eczema 04/29/2012  . HSV (herpes simplex virus) infection    1 and 2   . Tinea 04/29/2012    Past Surgical History:  Procedure Laterality Date  . BREAST SURGERY    . CESAREAN SECTION     Twice: 2001 & 2005  . COLONOSCOPY    . POLYPECTOMY    . WISDOM TOOTH EXTRACTION      Family History  Problem Relation Age of Onset  . Hypertension Mother   . Alzheimer's disease Mother   . Depression Mother   . Eczema Mother   . Stroke Mother   . Alcohol abuse  Mother   . Parkinson's disease Father   . Stroke Father   . Hypertension Brother   . Depression Brother   . Tuberculosis Maternal Aunt   . Prostate cancer Maternal Uncle   . Lung cancer Maternal Uncle   . Colon polyps Brother   . Colon cancer Brother        pt 32-47 yrs old when dx with colon ca  . Esophageal cancer Neg Hx   . Rectal cancer Neg Hx   . Stomach cancer Neg Hx     Social History   Socioeconomic History  . Marital status: Married    Spouse name: Not on file  . Number of children: 2  . Years of education: Not on  file  . Highest education level: Some college, no degree  Occupational History  . Occupation: Medical illustrator    Comment: self employed  Tobacco Use  . Smoking status: Former Smoker    Types: Cigars, Cigarettes    Quit date: 07/2011    Years since quitting: 9.5  . Smokeless tobacco: Never Used  . Tobacco comment: cigar july 4th,2020  Vaping Use  . Vaping Use: Never used  Substance and Sexual Activity  . Alcohol use: Yes    Alcohol/week: 3.0 - 4.0 standard drinks    Types: 3 - 4 Glasses of wine per week  . Drug use: No  . Sexual activity: Yes    Birth control/protection: Condom  Other Topics Concern  . Not on file  Social History Narrative   Lives at home with spouse and 2 daughters   Right handed   Drinks 2 cups of coffee daily   Social Determinants of Health   Financial Resource Strain: Not on file  Food Insecurity: Not on file  Transportation Needs: Not on file  Physical Activity: Not on file  Stress: Not on file  Social Connections: Not on file  Intimate Partner Violence: Not on file      Review of systems: All other review of systems negative except as mentioned in the HPI.   Physical Exam: Vitals:   02/12/21 0959  BP: (!) 150/78  Pulse: 60   Body mass index is 28.36 kg/m. Gen:      No acute distress HEENT:  sclera anicteric Abd:      soft, non-tender; no palpable masses, no distension Ext:    No edema Neuro: alert and oriented x 3 Psych: normal mood and affect Rectal exam: Increased anal sphincter tone with tenderness, + anal fissure , small nonthrombosed external hemorrhoids Anoscopy: Anterior anal fissure, Small internal hemorrhoids  Data Reviewed:  Reviewed labs, radiology imaging, old records and pertinent past GI work up   Assessment and Plan/Recommendations:  55 year old very pleasant female with complaints of rectal bleeding and discomfort Anal fissure and rectal exam Use nitroglycerin 0.125% small pea-sized amount 3 times daily for 6  to 8 weeks Benefiber 1 tablespoon twice daily with meals  If continues to have persistent rectal bleeding, will consider colonoscopy for further evaluation  History of advanced adenomatous colon polyp, due for surveillance colonoscopy in July 2023  Return in 2 months or sooner if needed   The patient was provided an opportunity to ask questions and all were answered. The patient agreed with the plan and demonstrated an understanding of the instructions.  Damaris Hippo , MD    CC: Ronnald Nian, DO

## 2021-02-15 ENCOUNTER — Telehealth: Payer: Self-pay | Admitting: Family Medicine

## 2021-02-15 MED ORDER — GABAPENTIN 300 MG PO CAPS: 300.0000 mg | ORAL_CAPSULE | Freq: Every day | ORAL | 1 refills | Status: AC

## 2021-02-15 NOTE — Telephone Encounter (Signed)
rx sent

## 2021-02-15 NOTE — Telephone Encounter (Signed)
Please see message and advise.  Thank you. ° °

## 2021-02-15 NOTE — Telephone Encounter (Signed)
Pt called to see if Dr. Bryan Lemma will fill Gabapentin for her. She takes 1 300mg  capsule PRN for occipital nerve pain. Pt has not seen Neurology in several years. Please advise.   Call back # [place], [place] AT [facility] [address] Phone:  [phone]  Fax:  [phone]

## 2021-02-21 ENCOUNTER — Encounter: Payer: Self-pay | Admitting: Family Medicine

## 2021-02-21 NOTE — Telephone Encounter (Signed)
Spoke to patient and advised that RX was sent to pharmacy on 02/15/21   She will go by and pick it up. Dm/cma

## 2021-04-19 DIAGNOSIS — D649 Anemia, unspecified: Secondary | ICD-10-CM | POA: Insufficient documentation

## 2021-04-19 DIAGNOSIS — K602 Anal fissure, unspecified: Secondary | ICD-10-CM | POA: Insufficient documentation

## 2021-04-19 DIAGNOSIS — G529 Cranial nerve disorder, unspecified: Secondary | ICD-10-CM | POA: Insufficient documentation

## 2021-05-18 ENCOUNTER — Ambulatory Visit (INDEPENDENT_AMBULATORY_CARE_PROVIDER_SITE_OTHER): Payer: BC Managed Care – PPO | Admitting: Gastroenterology

## 2021-05-18 ENCOUNTER — Encounter: Payer: Self-pay | Admitting: Gastroenterology

## 2021-05-18 VITALS — BP 142/80 | HR 64 | Ht 65.0 in | Wt 165.0 lb

## 2021-05-18 DIAGNOSIS — K602 Anal fissure, unspecified: Secondary | ICD-10-CM

## 2021-05-18 DIAGNOSIS — K5904 Chronic idiopathic constipation: Secondary | ICD-10-CM

## 2021-05-18 NOTE — Progress Notes (Signed)
Candice [name]    [id number]    07/15/1966  [facility], Garvin Fila, DO  Referring Physician: Ronnald Nian, DO No address on file   Chief complaint:  constipation  HPI:  55 year old very pleasant female here for follow-up visit for chronic constipation and anal fissure   She has noted improvement of rectal pain and bleeding after using nitroglycerin for rectal.  She had only 1 episode of bleeding since last visit in May after she was straining with a large bowel movement.  It was small-volume and self resolved.  Overall her bowel habits have improved.  She is taking over-the-counter fiber and probiotic/prebiotic supplement .   Denies any melena or abdominal pain.  No nausea, vomiting, decreased appetite or weight loss.     Colonoscopy 10/30/2018 - The perianal and digital rectal examinations were normal. Superficial erosion at ileocecal valve, otherwise terminal ileum appeared normal. - A 18 mm polyp was found in the recto-sigmoid colon. The polyp was pedunculated. The polyp was removed with a hot snare. Resection and retrieval were complete. - Non-bleeding internal hemorrhoids were found during retroflexion. The hemorrhoids were small.   Colonoscopy 04/19/2019 - Post-polypectomy scar in the recto-sigmoid colon. - Non-bleeding internal hemorrhoids. - The examination was otherwise normal.   Outpatient Encounter Medications as of 05/18/2021  Medication Sig   nitroGLYCERIN (NITRODUR - DOSED IN MG/24 HR) 0.2 mg/hr patch APPLY 1/4 PATCH TO AFFECTED AREA AS DIRECTED BY MD AND CHANGE EVERY 24 HOURS. (Patient taking differently: Apply 1/4 patch to affected area as directed by MD and change every 24 hours)   Diclofenac Sodium (PENNSAID) 2 % SOLN Place 1 application onto the skin 2 (two) times daily.   docusate sodium (COLACE) 100 MG capsule Take 1 capsule (100 mg total) by mouth 2 (two) times daily.   gabapentin (NEURONTIN) 300 MG capsule  Take 1 capsule (300 mg total) by mouth at bedtime.   ibuprofen (ADVIL) 200 MG tablet Take 200 mg by mouth every 6 (six) hours as needed.   omeprazole (PRILOSEC) 40 MG capsule Take 1 capsule (40 mg total) by mouth daily.   polyethylene glycol powder (GLYCOLAX/MIRALAX) 17 GM/SCOOP powder Take 17 g by mouth 2 (two) times daily as needed.   zolpidem (AMBIEN) 10 MG tablet TAKE 1 TABLET(10 MG) BY MOUTH AT BEDTIME AS NEEDED FOR SLEEP   [DISCONTINUED] AMBULATORY NON FORMULARY MEDICATION Medication Name: Nitroglycerin ointment 0.125% Use pea sized amount per rectum three times a day for 6-8 weeks   No facility-administered encounter medications on file as of 05/18/2021.    Allergies as of 05/18/2021   (No Known Allergies)    Past Medical History:  Diagnosis Date   Allergy    seasonal   Anemia    Chlamydia    Eczema 04/29/2012   HSV (herpes simplex virus) infection    1 and 2    Tinea 04/29/2012    Past Surgical History:  Procedure Laterality Date   BREAST SURGERY     CESAREAN SECTION     Twice: 2001 & 2005   COLONOSCOPY     POLYPECTOMY     WISDOM TOOTH EXTRACTION      Family History  Problem Relation Age of Onset   Hypertension Mother    Alzheimer's disease Mother    Depression Mother    Eczema Mother    Stroke Mother    Alcohol abuse Mother    Parkinson's disease Father  Stroke Father    Hypertension Brother    Depression Brother    Tuberculosis Maternal Aunt    Prostate cancer Maternal Uncle    Lung cancer Maternal Uncle    Colon polyps Brother    Colon cancer Brother        pt 53-47 yrs old when dx with colon ca   Esophageal cancer Neg Hx    Rectal cancer Neg Hx    Stomach cancer Neg Hx     Social History   Socioeconomic History   Marital status: Married    Spouse name: Not on file   Number of children: 2   Years of education: Not on file   Highest education level: Some college, no degree  Occupational History   Occupation: Medical illustrator    Comment:  self employed  Tobacco Use   Smoking status: Former    Types: Cigars, Cigarettes    Quit date: 07/2011    Years since quitting: 9.8   Smokeless tobacco: Never   Tobacco comments:    cigar july 4th,2020  Vaping Use   Vaping Use: Never used  Substance and Sexual Activity   Alcohol use: Yes    Alcohol/week: 3.0 - 4.0 standard drinks    Types: 3 - 4 Glasses of wine per week   Drug use: No   Sexual activity: Yes    Birth control/protection: Condom  Other Topics Concern   Not on file  Social History Narrative   Lives at home with spouse and 2 daughters   Right handed   Drinks 2 cups of coffee daily   Social Determinants of Health   Financial Resource Strain: Not on file  Food Insecurity: Not on file  Transportation Needs: Not on file  Physical Activity: Not on file  Stress: Not on file  Social Connections: Not on file  Intimate Partner Violence: Not on file      Review of systems: All other review of systems negative except as mentioned in the HPI.   Physical Exam: There were no vitals filed for this visit. Body mass index is 27.46 kg/m. Gen:      No acute distress HEENT:  sclera anicteric Abd:      soft, non-tender; no palpable masses, no distension Ext:    No edema Neuro: alert and oriented x 3 Psych: normal mood and affect  Data Reviewed:  Reviewed labs, radiology imaging, old records and pertinent past GI work up   Assessment and Plan/Recommendations:  55 year old very pleasant female with history of large pedunculated polyp with high-grade dysplasia s/p resection in February 2020, flexible sigmoidoscopy in July 2020 with no residual polyp   Anal fissure: Improved  Chronic idiopathic constipation: Continue high-fiber diet and increase water intake Ok to use fiber supplements and prebiotic  Small-volume bright red blood per rectum likely secondary to hemorrhage from internal hemorrhoids.  Continue to monitor  If has recurrent bleeding or persistent  rectal bleeding, will consider colonoscopy earlier   History of advanced adenomatous colon polyp, due for surveillance colonoscopy in July 2023   Return in 1 year or sooner if needed  The patient was provided an opportunity to ask questions and all were answered. The patient agreed with the plan and demonstrated an understanding of the instructions.  Damaris Hippo , MD    CC: Ronnald Nian, DO

## 2021-05-18 NOTE — Patient Instructions (Addendum)
Follow up as needed  If you are age 53 or older, your body mass index should be between 23-30. Your Body mass index is 27.46 kg/m. If this is out of the aforementioned range listed, please consider follow up with your Primary Care Provider.  If you are age 22 or younger, your body mass index should be between 19-25. Your Body mass index is 27.46 kg/m. If this is out of the aformentioned range listed, please consider follow up with your Primary Care Provider.   __________________________________________________________  The [place] providers would like to encourage you to use [facility] to communicate with providers for non-urgent requests or questions.  Due to long hold times on the telephone, sending your provider a message by [facility] may be a faster and more efficient way to get a response.  Please allow 48 business hours for a response.  Please remember that this is for non-urgent requests.    I appreciate the  opportunity to care for you  Thank You   Harl Bowie , MD

## 2021-05-21 ENCOUNTER — Encounter: Payer: Self-pay | Admitting: Gastroenterology

## 2021-05-30 ENCOUNTER — Encounter: Payer: BC Managed Care – PPO | Admitting: Family Medicine

## 2021-05-30 ENCOUNTER — Other Ambulatory Visit: Payer: Self-pay | Admitting: Family Medicine

## 2021-05-30 DIAGNOSIS — K219 Gastro-esophageal reflux disease without esophagitis: Secondary | ICD-10-CM

## 2021-05-30 DIAGNOSIS — G4700 Insomnia, unspecified: Secondary | ICD-10-CM

## 2021-05-30 MED ORDER — OMEPRAZOLE 40 MG PO CPDR: 40.0000 mg | DELAYED_RELEASE_CAPSULE | Freq: Every day | ORAL | 0 refills | Status: DC

## 2021-05-30 MED ORDER — ZOLPIDEM TARTRATE 10 MG PO TABS: ORAL_TABLET | ORAL | 0 refills | Status: AC

## 2021-05-30 NOTE — Telephone Encounter (Signed)
Please review and advise. Thanks. Dm/cma  

## 2021-05-30 NOTE — Telephone Encounter (Signed)
Caller Name: Unkown, self Call back phone #: [phone]    MEDICATION(S):  omeprazole (PRILOSEC) 40 MG capsule  - 4 left & going out of town zolpidem (AMBIEN) 10 MG tablet  - has about 2 wks  Pt will be out of town for 10 days 9/14-9/24 Last OV 02/01/21 Dr. Bryan Lemma [facility] 12/6 with Dr. Gena Fray   Has the patient contacted their pharmacy? Yes and told to call MD office  Preferred Pharmacy:  [facility] [place], [address] AT [facility] [address] Phone:  [phone]  Fax:  [phone]

## 2021-05-31 NOTE — Telephone Encounter (Signed)
Patient notified VIA phone that RX  refills were sent to the pharmacy. Dm/cma

## 2021-06-18 ENCOUNTER — Encounter: Payer: BC Managed Care – PPO | Attending: Obstetrics and Gynecology | Admitting: Dietician

## 2021-07-10 ENCOUNTER — Other Ambulatory Visit: Payer: Self-pay

## 2021-07-11 ENCOUNTER — Ambulatory Visit (INDEPENDENT_AMBULATORY_CARE_PROVIDER_SITE_OTHER): Payer: BC Managed Care – PPO | Admitting: Family Medicine

## 2021-07-11 VITALS — BP 138/88 | HR 76 | Temp 97.6°F | Ht 65.0 in | Wt 166.4 lb

## 2021-07-11 DIAGNOSIS — K602 Anal fissure, unspecified: Secondary | ICD-10-CM | POA: Diagnosis not present

## 2021-07-11 DIAGNOSIS — I10 Essential (primary) hypertension: Secondary | ICD-10-CM | POA: Insufficient documentation

## 2021-07-11 DIAGNOSIS — N951 Menopausal and female climacteric states: Secondary | ICD-10-CM | POA: Insufficient documentation

## 2021-07-11 MED ORDER — AMLODIPINE BESYLATE 5 MG PO TABS
5.0000 mg | ORAL_TABLET | Freq: Every day | ORAL | 3 refills | Status: AC
Start: 2021-07-11 — End: ?

## 2021-07-11 NOTE — Progress Notes (Signed)
[name] [address] [place] [address] Dept: [phone] Dept Fax: [phone]  Office Visit  Subjective:    Patient ID: Candice [name], female    DOB: [birth year], 55 y.o..   MRN: [id number]  Chief Complaint  Patient presents with   Acute Visit    C/o having blood in stool x 8-10 months. Have been seeing GI doctor and given a cream to use.  This started happening 4 days a week x 2 months.   Had a colonoscopy in 03/2019 and has one scheduled 10/2021.   Seen Dr Elesa Massed in May and August 2022.   Declines flu shot.     History of Present Illness:  Patient is in today for re-evaluation of rectal bleeding related to an anal fissure or internal hemorrhoid. She had an initial episode of rectal bleeding in May, seen first by Dr. Bryan Lemma, and then referred to Dr. Silverio Decamp (gastroenterology). She was found to have an anal fissure and small internal and external hemorrhoids. She was treated with nitroglycerine ointment and adding fiber to her diet. She had previously seen Dr. Silverio Decamp for a issue with a feeling of incomplete evacuation of her rectum and had  colonoscopies in 10/2018 and 03/2019.  She followed up with Dr. Azucena Fallen in 04/2021. She reported only a single episode of bleeding since May. She had started probiotics for her intestinal complaints, but has stopped taking fiber. About 2 weeks after that visit, she developed bright red bleeding with most bowel movements. She notes this can be spotting or heavier, like menses at times. She has not resumed any prior treatments for this.  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   Synovitis of finger 06/08/2020   GERD (gastroesophageal reflux disease) 09/14/2019   Occipital neuralgia of right side  09/05/2017   Eczema 04/29/2012   Past Surgical History:  Procedure Laterality Date   BREAST REDUCTION SURGERY     CESAREAN SECTION     Twice: 2001 & 2005   COLONOSCOPY     dental implants     POLYPECTOMY     WISDOM TOOTH EXTRACTION     Family History  Problem Relation Age of Onset   Alzheimer's disease Mother    Depression Mother    Eczema Mother    Parkinson's disease Father    Stroke Father    Hypertension Father    Hypertension Brother    Depression Brother    Colon polyps Brother    Colon cancer Brother        pt 44-47 yrs old when dx with colon ca   Tuberculosis Maternal Aunt    Prostate cancer Maternal Uncle    Lung cancer Maternal Uncle    Esophageal cancer Neg Hx    Rectal cancer Neg Hx    Stomach cancer Neg Hx    Liver disease Neg Hx    Outpatient Medications Prior to Visit  Medication Sig Dispense Refill   AMBULATORY NON FORMULARY MEDICATION Medication Name: Greens & Superfoods Coconut-- dietary supplement- Take 1 scoop once every other day     gabapentin (NEURONTIN) 300 MG capsule Take 1 capsule (300 mg total) by mouth at bedtime. 90 capsule 1   ibuprofen (ADVIL) 200 MG tablet Take 200 mg by mouth every 6 (six) hours as needed.  nitroGLYCERIN (NITRODUR - DOSED IN MG/24 HR) 0.2 mg/hr patch APPLY 1/4 PATCH TO AFFECTED AREA AS DIRECTED BY MD AND CHANGE EVERY 24 HOURS. (Patient taking differently: Apply 1/4 patch to affected area as directed by MD and change every 24 hours) 30 patch 2   Nitroglycerin 0.4 % OINT Place rectally in the morning and at bedtime.     omeprazole (PRILOSEC) 40 MG capsule Take 1 capsule (40 mg total) by mouth daily. 90 capsule 0   zolpidem (AMBIEN) 10 MG tablet TAKE 1 TABLET(10 MG) BY MOUTH AT BEDTIME AS NEEDED FOR SLEEP 30 tablet 0   No facility-administered medications prior to visit.   No Known Allergies    Objective:   Today's Vitals   07/11/21 1530  BP: 138/88  Pulse: 76  Temp: 97.6 F (36.4 C)  TempSrc: Temporal  SpO2:  98%  Weight: 166 lb 6.4 oz (75.5 kg)  Height: 5\' 5"  (1.651 m)   Body mass index is 27.69 kg/m.   General: Well developed, well nourished. No acute distress. Psych: Alert and oriented x3. Normal mood and affect.  Health Maintenance Due  Topic Date Due   Pneumococcal Vaccine 18-33 Years old (1 - PCV) Never done   Hepatitis C Screening  Never done   Zoster Vaccines- Shingrix (1 of 2) Never done   COVID-19 Vaccine (3 - Booster for Pfizer series) 05/28/2020   MAMMOGRAM  11/11/2020   INFLUENZA VACCINE  04/23/2021     Assessment & Plan:   1. Anal fissure Candice [name] likely has a recurrence of her anal fissure +/- her hemorrhoidal bleeding. I recommend she resume use of fiber and resume use of her nitroglycerin ointment. I recommend she contact Dr. Azucena Fallen, who noted she would consider an earlier repeat colonoscopy if bleeding was recurrent or persistent.  2. Essential hypertension Candice [name] has had persistent elevated blood pressures in the Stage 1 hypertension range. We discussed nonpharmacologic approaches to treating hypertension and the long-term impacts of such. She is agreeable to start a medication to lower her blood pressure. She has an appointment for [facility] with me in Dec. I will reassess her blood pressure control then.  - amLODipine (NORVASC) 5 MG tablet; Take 1 tablet (5 mg total) by mouth daily.  Dispense: 90 tablet; Refill: 3   Haydee Salter, MD

## 2021-08-28 ENCOUNTER — Encounter: Payer: BC Managed Care – PPO | Admitting: Family Medicine

## 2021-09-08 ENCOUNTER — Other Ambulatory Visit: Payer: Self-pay | Admitting: Family

## 2021-09-08 DIAGNOSIS — K219 Gastro-esophageal reflux disease without esophagitis: Secondary | ICD-10-CM

## 2021-09-10 ENCOUNTER — Other Ambulatory Visit: Payer: Self-pay | Admitting: Family

## 2021-09-10 DIAGNOSIS — K219 Gastro-esophageal reflux disease without esophagitis: Secondary | ICD-10-CM

## 2021-09-13 ENCOUNTER — Telehealth: Payer: Self-pay | Admitting: Family Medicine

## 2021-09-13 DIAGNOSIS — K602 Anal fissure, unspecified: Secondary | ICD-10-CM

## 2021-09-13 NOTE — Telephone Encounter (Signed)
Refill request for Nitroglycerin ointment last OV 07/17/21 patient has an upcoming appointment scheduled for 09/18/21. Okay to send in refill? Please advise

## 2021-09-14 MED ORDER — NITROGLYCERIN 0.4 % RE OINT: TOPICAL_OINTMENT | RECTAL | 1 refills | Status: DC

## 2021-09-14 NOTE — Telephone Encounter (Signed)
Patient aware Rx sent in  

## 2021-09-18 ENCOUNTER — Other Ambulatory Visit: Payer: Self-pay

## 2021-09-18 ENCOUNTER — Ambulatory Visit: Payer: BC Managed Care – PPO | Admitting: Family Medicine

## 2021-09-18 ENCOUNTER — Encounter: Payer: Self-pay | Admitting: Family Medicine

## 2021-09-18 VITALS — BP 122/80 | HR 62 | Temp 97.4°F | Ht 65.0 in | Wt 171.6 lb

## 2021-09-18 DIAGNOSIS — G4700 Insomnia, unspecified: Secondary | ICD-10-CM | POA: Insufficient documentation

## 2021-09-18 DIAGNOSIS — K219 Gastro-esophageal reflux disease without esophagitis: Secondary | ICD-10-CM | POA: Diagnosis not present

## 2021-09-18 DIAGNOSIS — E782 Mixed hyperlipidemia: Secondary | ICD-10-CM

## 2021-09-18 DIAGNOSIS — M5481 Occipital neuralgia: Secondary | ICD-10-CM

## 2021-09-18 DIAGNOSIS — Z1159 Encounter for screening for other viral diseases: Secondary | ICD-10-CM

## 2021-09-18 DIAGNOSIS — I10 Essential (primary) hypertension: Secondary | ICD-10-CM | POA: Diagnosis not present

## 2021-09-18 DIAGNOSIS — R635 Abnormal weight gain: Secondary | ICD-10-CM

## 2021-09-18 DIAGNOSIS — Z720 Tobacco use: Secondary | ICD-10-CM

## 2021-09-18 DIAGNOSIS — E785 Hyperlipidemia, unspecified: Secondary | ICD-10-CM | POA: Insufficient documentation

## 2021-09-18 LAB — LIPID PANEL
Cholesterol: 245 mg/dL — ABNORMAL HIGH (ref 0–200)
HDL: 87.7 mg/dL (ref >39.00)
LDL Cholesterol: 136 mg/dL — ABNORMAL HIGH (ref 0–99)
NonHDL: 157.6
Total CHOL/HDL Ratio: 3
Triglycerides: 106 mg/dL (ref 0.0–149.0)
VLDL: 21.2 mg/dL (ref 0.0–40.0)

## 2021-09-18 LAB — TSH: TSH: 0.94 u[IU]/mL (ref 0.35–5.50)

## 2021-09-18 NOTE — Progress Notes (Signed)
[facility] [address] [place] [address] Dept: [phone] Dept Fax: [phone]  Transfer of Care Office Visit  Subjective:    Patient ID: Candice [name], female    DOB: 01-15-1966, 55 y.o..   MRN: [id number]  Chief Complaint  Patient presents with   Establish Care    [facility]- establish care.      History of Present Illness:  Patient is in today to establish care. Candice [name] was born in [place], [place] and grew up in [place], [place]. 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 She did not compelte a degree. 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 She has been married for 26 years, but is currently separated. She has two children (21, 17). She relapsed to  smoking in Sept. Currently is smoking about 7 cigarettes a week. She occasionally drinks wine. She denies drug use.  Candice [name] has a history of hypertension. She was prescribed amlodipine by her GYN, but admits she does not take this every day.  Candice [name] has a history of an anal fissure. This has been managed with a nitroglycerine ointment. It is into currently active.  Candice [name] has a history of GERD, which is well managed with Prilosec.  Candice [name] has a history of an occipital neuralgia. When this flares, she uses ibuprofen. If this does not resolve the spasm, she will use gabapentin.  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   Synovitis of finger 06/08/2020   GERD (gastroesophageal reflux disease) 09/14/2019   Occipital neuralgia of right side 09/05/2017   Eczema 04/29/2012   Past Surgical History:  Procedure Laterality Date   BREAST REDUCTION SURGERY      CESAREAN SECTION     Twice: 2001 & 2005   COLONOSCOPY     dental implants     POLYPECTOMY     WISDOM TOOTH EXTRACTION     Family History  Problem Relation Age of Onset   Alzheimer's disease Mother    Depression Mother    Eczema Mother    Parkinson's disease Father    Stroke Father    Hypertension Father    Hypertension Brother    Depression Brother    Colon polyps Brother    Colon cancer Brother        pt 37-47 yrs old when dx with colon ca   Tuberculosis Maternal Aunt    Prostate cancer Maternal Uncle    Lung cancer Maternal Uncle    Esophageal cancer Neg Hx    Rectal cancer Neg Hx    Stomach cancer Neg Hx    Liver disease Neg Hx    Outpatient Medications Prior to Visit  Medication Sig Dispense Refill   AMBULATORY NON FORMULARY MEDICATION Medication Name: Greens & Superfoods Coconut-- dietary supplement- Take 1 scoop once every other day     amLODipine (NORVASC) 5 MG tablet Take 1 tablet (5 mg total) by mouth daily. 90 tablet 3   cholecalciferol (VITAMIN D3) 25 MCG (1000 UNIT) tablet Take 1,000 Units by mouth daily.     gabapentin (NEURONTIN) 300 MG capsule Take 1 capsule (300 mg total) by mouth at bedtime. 90 capsule 1   ibuprofen (ADVIL) 200 MG tablet Take 200  mg by mouth every 6 (six) hours as needed.     Multiple Vitamin (MULTIVITAMIN ADULT PO) Take by mouth.     omeprazole (PRILOSEC) 40 MG capsule TAKE 1 CAPSULE(40 MG) BY MOUTH DAILY 90 capsule 0   zolpidem (AMBIEN) 10 MG tablet TAKE 1 TABLET(10 MG) BY MOUTH AT BEDTIME AS NEEDED FOR SLEEP 30 tablet 0   Nitroglycerin 0.4 % OINT Apply small amount around rectum twice daily for 4 weeks. (Patient not taking: Reported on 09/18/2021) 30 g 1   No facility-administered medications prior to visit.   No Known Allergies    Objective:   Today's Vitals   09/18/21 1110  BP: 122/80  Pulse: 62  Temp: (!) 97.4 F (36.3 C)  TempSrc: Temporal  SpO2: 98%  Weight: 171 lb 9.6 oz (77.8 kg)  Height: 5\' 5"  (1.651 m)   Body mass  index is 28.56 kg/m.   General: Well developed, well nourished. No acute distress. Psych: Alert and oriented. Normal mood and affect.  Health Maintenance Due  Topic Date Due   Pneumococcal Vaccine 9-74 Years old (1 - PCV) Never done   Hepatitis C Screening  Never done   Zoster Vaccines- Shingrix (1 of 2) Never done   COVID-19 Vaccine (3 - Booster for Pfizer series) 02/21/2020   INFLUENZA VACCINE  04/23/2021   Lab Results Last lipids Lab Results  Component Value Date   CHOL 260 (H) 05/25/2020   HDL 91.70 05/25/2020   LDLCALC 157 (H) 05/25/2020   TRIG 55.0 05/25/2020   CHOLHDL 3 05/25/2020     Assessment & Plan:   1. Essential hypertension Blood pressure is in good control today, despite some nonadherence to her regimen.  2. Gastroesophageal reflux disease, unspecified whether esophagitis present Well controlled with Prilosec.  3. Occipital neuralgia of right side Stable with use of ibuprofen and PRN gabapentin.  4. Weight gain Candice [name] raises concern for possible thyroid disease due to difficulty with weight loss. I will check a TSH.  - TSH  5. Encounter for hepatitis C screening test for low risk patient  - HCV Ab w Reflex to Quant PCR  6. Mixed hyperlipidemia Prior high total and LDL cholesterol. Borderline risk for CV disease. We will repeat today.  - Lipid panel  7. Tobacco use Recommended smoking cessation. At current low level of nicotine use, medication assistance would not be indicated.   Candice Salter, MD

## 2021-09-19 ENCOUNTER — Telehealth: Payer: Self-pay | Admitting: Family Medicine

## 2021-09-19 LAB — HCV AB W REFLEX TO QUANT PCR: HCV Ab: <0.1 s/co ratio (ref 0.0–0.9)

## 2021-09-19 LAB — HCV INTERPRETATION

## 2021-09-19 NOTE — Telephone Encounter (Signed)
Pt called and said she had an appt with Dr Gena Fray yesterday and she woke up today feeling bad, she called the office early this morning and they told her to take a covid test and she did and it came back positive. She wanted to know what she can do. Please advise. Call back [phone]

## 2021-09-19 NOTE — Telephone Encounter (Signed)
Called patient and spoke to her regarding a positive covid test,  advised that she would need an appointment for anti-viral medication. Transferred her to front who scheduled her with a provider on 09/20/21.  Dm/cma

## 2021-09-20 ENCOUNTER — Telehealth (INDEPENDENT_AMBULATORY_CARE_PROVIDER_SITE_OTHER): Payer: BC Managed Care – PPO | Admitting: Family Medicine

## 2021-09-20 ENCOUNTER — Encounter: Payer: Self-pay | Admitting: Family Medicine

## 2021-09-20 DIAGNOSIS — U071 COVID-19: Secondary | ICD-10-CM

## 2021-09-20 MED ORDER — MOLNUPIRAVIR EUA 200MG CAPSULE: 4.0000 | ORAL_CAPSULE | Freq: Two times a day (BID) | ORAL | 0 refills | Status: AC

## 2021-09-20 MED ORDER — BENZONATATE 100 MG PO CAPS: ORAL_CAPSULE | ORAL | 0 refills | Status: AC

## 2021-09-20 NOTE — Patient Instructions (Signed)
°HOME CARE TIPS: ° ° °-I sent the medication(s) we discussed to your pharmacy: °Meds ordered this encounter  °Medications  ° molnupiravir EUA (LAGEVRIO) 200 mg CAPS capsule  °  Sig: Take 4 capsules (800 mg total) by mouth 2 (two) times daily for 5 days.  °  Dispense:  40 capsule  °  Refill:  0  ° benzonatate (TESSALON PERLES) 100 MG capsule  °  Sig: 1-2 capsules up to twice daily as needed for cough  °  Dispense:  30 capsule  °  Refill:  0  °  ° °-I sent in the Covid19 treatment or referral you requested per our discussion. Please see the information provided below and discuss further with the pharmacist/treatment team.  ° °-there is a chance of rebound illness after finishing your treatment. If you become sick again please isolate for an additional 5 days, plus 5 more days of masking.  ° °-can use tylenol if needed for fevers, aches and pains per instructions ° °-can use nasal saline a few times per day if you have nasal congestion ° °-stay hydrated, drink plenty of fluids and eat small healthy meals - avoid dairy ° °-can take 1000 IU (25mcg) Vit D3 and 100-500 mg of Vit C daily per instructions ° °-stay home while sick, except to seek medical care. If you have COVID19, you will likely be contagious for 7-10 days. Flu or Influenza is likely contagious for about 7 days. Other respiratory viral infections remain contagious for 5-10+ days depending on the virus and many other factors. Wear a good mask that fits snugly (such as N95 or KN95) if around others to reduce the risk of transmission. ° °It was nice to meet you today, and I really hope you are feeling better soon. I help [facility] out with telemedicine visits on Tuesdays and Thursdays and am happy to help if you need a follow up virtual visit on those days. Otherwise, if you have any concerns or questions following this visit please schedule a follow up visit with your Primary Care doctor or seek care at a local urgent care clinic to avoid delays in care.   ° ° °Seek in person care or schedule a follow up video visit promptly if your symptoms worsen, new concerns arise or you are not improving with treatment. Call 911 and/or seek emergency care if your symptoms are severe or life threatening. ° °  °Fact Sheet for Patients And Caregivers °Emergency Use Authorization (EUA) Of LAGEVRIO™ (molnupiravir) capsules For °Coronavirus Disease 2019 (COVID-19) ° °What is the most important information I should know about LAGEVRIO? °LAGEVRIO may cause serious side effects, including: °? LAGEVRIO may cause harm to your unborn baby. It is not known if LAGEVRIO will °harm your baby if you take LAGEVRIO during pregnancy. °o LAGEVRIO is not recommended for use in pregnancy. °o LAGEVRIO has not been studied in pregnancy. LAGEVRIO was studied in pregnant °animals only. When LAGEVRIO was given to pregnant animals, LAGEVRIO caused °harm to their unborn babies. °o You and your healthcare provider may decide that you should take LAGEVRIO during °pregnancy if there are no other COVID-19 treatment options approved or authorized by °the FDA that are accessible or clinically appropriate for you. °o If you and your healthcare provider decide that you should take LAGEVRIO during °pregnancy, you and your healthcare provider should discuss the known and potential °benefits and the potential risks of taking LAGEVRIO during pregnancy. °For individuals who are able to become pregnant: °? You should   use a reliable method of birth control (contraception) consistently and correctly °during treatment with LAGEVRIO and for 4 days after the last dose of LAGEVRIO. Talk to °your healthcare provider about reliable birth control methods. °? Before starting treatment with LAGEVRIO your healthcare provider may do a pregnancy test °to see if you are pregnant before starting treatment with LAGEVRIO. °? Tell your healthcare provider right away if you become pregnant or think you may be °pregnant during treatment with  LAGEVRIO. °Pregnancy Surveillance Program: °? There is a pregnancy surveillance program for individuals who take LAGEVRIO during °pregnancy. The purpose of this program is to collect information about the health of you and °your baby. Talk to your healthcare provider about how to take part in this program. °? If you take LAGEVRIO during pregnancy and you agree to participate in the pregnancy °surveillance program and allow your healthcare provider to share your information with °[employer], then your healthcare provider will report your use of LAGEVRIO °during pregnancy to [employer] by calling [phone] or °pregnancyreporting.[email or web address]. °For individuals who are sexually active with partners who are able to become pregnant: °? It is not known if LAGEVRIO can affect sperm. While the risk is regarded as low, animal °studies to fully assess the potential for LAGEVRIO to affect the babies of males treated with °LAGEVRIO have not been completed. A reliable method of birth control (contraception) °should be used consistently and correctly during treatment with LAGEVRIO and for at least °3 months after the last dose. The risk to sperm beyond 3 months is not known. Studies to °understand the risk to sperm beyond 3 months are ongoing. Talk to your healthcare provider °about reliable birth control methods. Talk to your healthcare provider if you have questions °or concerns about how LAGEVRIO may affect sperm. °You are being given this fact sheet because your healthcare provider believes it is necessary to °provide you with LAGEVRIO for the treatment of adults with mild-to-moderate coronavirus °disease 2019 (COVID-19) with positive results of direct SARS-CoV-2 viral testing, and °who are at high risk for progression to severe COVID-19 including hospitalization or death, and °for whom other COVID-19 treatment options approved or authorized by the FDA are not °accessible or clinically appropriate. °The  [place] Food and Drug Administration (FDA) has issued an Emergency Use Authorization °(EUA) to make LAGEVRIO available during the COVID-19 pandemic (for more details about an °EUA please see “What is an Emergency Use Authorization?” at the end of this document). °LAGEVRIO is not an FDA-approved medicine in the [place]. Read this Fact Sheet for °information about LAGEVRIO. Talk to your healthcare provider about your options if you have °any questions. It is your choice to take LAGEVRIO. ° °What is COVID-19? °COVID-19 is caused by a virus called a coronavirus. You can get COVID-19 through close °contact with another person who has the virus. °COVID-19 illnesses have ranged from very mild-to-severe, including illness resulting in death. °While information so far suggests that most COVID-19 illness is mild, serious illness can °happen and may cause some of your other medical conditions to become worse. Older people °and people of all ages with severe, long lasting (chronic) medical conditions like heart disease, °lung disease and diabetes, for example seem to be at higher risk of being hospitalized for °COVID-19. ° °What is LAGEVRIO? °LAGEVRIO is an investigational medicine used to treat mild-to-moderate COVID-19 in adults: °? with positive results of direct SARS-CoV-2 viral testing, and °? who are at high risk for progression   to severe COVID-19 including hospitalization or death, °and for whom other COVID-19 treatment options approved or authorized by the FDA are °not accessible or clinically appropriate. °The FDA has authorized the emergency use of LAGEVRIO for the treatment of mild-tomoderate COVID-19 in adults under an EUA. For more information on EUA, see the “What is °an Emergency Use Authorization (EUA)?” section at the end of this Fact Sheet. °LAGEVRIO is not authorized: °? for use in people less than 18 years of age. °? for prevention of COVID-19. °? for people needing hospitalization for COVID-19. °? for  use for longer than 5 consecutive days. ° °What should I tell my healthcare provider before I take LAGEVRIO? °Tell your healthcare provider if you: °? Have any allergies °? Are breastfeeding or plan to breastfeed °? Have any serious illnesses °? Are taking any medicines (prescription, over-the-counter, vitamins, or herbal products). ° °How do I take LAGEVRIO? °? Take LAGEVRIO exactly as your healthcare provider tells you to take it. °? Take 4 capsules of LAGEVRIO every 12 hours (for example, at 8 am and at 8 pm) °? Take LAGEVRIO for 5 days. It is important that you complete the full 5 days of °treatment with LAGEVRIO. Do not stop taking LAGEVRIO before you complete the full 5 °days of treatment, even if you feel better. °? Take LAGEVRIO with or without food. °? You should stay in isolation for as long as your healthcare provider tells you to. Talk to °your healthcare provider if you are not sure about how to properly isolate while you have °COVID-19. °? Swallow LAGEVRIO capsules whole. Do not open, break, or crush the capsules. If you °cannot swallow capsules whole, tell your healthcare provider. °? What to do if you miss a dose: °o If it has been less than 10 hours since the missed dose, take it as soon as you °remember °o If it has been more than 10 hours since the missed dose, skip the missed dose °and take your dose at the next scheduled time. °? Do not double the dose of LAGEVRIO to make up for a missed dose. ° °What are the important possible side effects of LAGEVRIO? °? See, “What is the most important information I should know about LAGEVRIO?” °? Allergic Reactions. Allergic reactions can happen in people taking LAGEVRIO, even °after only 1 dose. Stop taking LAGEVRIO and call your healthcare provider right away if °you get any of the following symptoms of an allergic reaction: °o hives °o rapid heartbeat °o trouble swallowing or breathing °o swelling of the mouth, lips, or face °o throat tightness °o  hoarseness °o skin rash °The most common side effects of LAGEVRIO are: °? diarrhea °? nausea °? dizziness °These are not all the possible side effects of LAGEVRIO. Not many people have taken °LAGEVRIO. Serious and unexpected side effects may happen. This medicine is still being °studied, so it is possible that all of the risks are not known at this time. ° °What other treatment choices are there? ° Veklury (remdesivir) is FDA-approved as an intravenous (IV) infusion for the treatment of mildto-moderate COVID-19 in certain adults and children. Talk with your doctor to see if Veklury is °appropriate for you. °Like LAGEVRIO, FDA may also allow for the emergency use of other medicines to treat people °with COVID-19. Go to https://www.fda.gov/emergency-preparedness-and-response/mcm-legalregulatory-and-policy-framework/emergency-use-authorization for more information. °It is your choice to be treated or not to be treated with LAGEVRIO. Should you decide not to °take it, it will not change your standard medical care. ° °  What if I am breastfeeding? °Breastfeeding is not recommended during treatment with LAGEVRIO and for 4 days after the °last dose of LAGEVRIO. If you are breastfeeding or plan to breastfeed, talk to your healthcare °provider about your options and specific situation before taking LAGEVRIO. ° °How do I report side effects with LAGEVRIO? °Contact your healthcare provider if you have any side effects that bother you or do not go away. °Report side effects to FDA MedWatch at www.fda.gov/medwatch or call 1-800-FDA-1088 (1- °800-332-1088). ° °How should I store LAGEVRIO? °? Store LAGEVRIO capsules at room temperature between 68°F to 77°F (20°C to 25°C). °? Keep LAGEVRIO and all medicines out of the reach of children and pets. °How can I learn more about COVID-19? °? Ask your healthcare provider. °? Visit www.cdc.gov/COVID19 °? Contact your local or state public health department. °? Call [employer] at  1-800-672-6372 (toll free in the [place] °? Visit www.molnupiravir.com ° °What Is an Emergency Use Authorization (EUA)? °The [place] FDA has made LAGEVRIO available under an emergency access °mechanism called an Emergency Use Authorization (EUA) The EUA is supported by a °Secretary of Health and Human Service (HHS) declaration that circumstances exist to justify °emergency use of drugs and biological products during the COVID-19 pandemic. °LAGEVRIO for the treatment of mild-to-moderate COVID-19 in adults with positive results of °direct SARS-CoV-2 viral testing, who are at high risk for progression to severe COVID-19, °including hospitalization or death, and for whom alternative COVID-19 treatment options °approved or authorized by FDA are not accessible or clinically appropriate, has not undergone °the same type of review as an FDA-approved product. In issuing an EUA under the COVID-19 °public health emergency, the FDA has determined, among other things, that based on the total °amount of scientific evidence available including data from adequate and well-controlled clinical °trials, if available, it is reasonable to believe that the product may be effective for diagnosing, °treating, or preventing COVID-19, or a serious or life-threatening disease or condition caused by °COVID-19; that the known and potential benefits of the product, when used to diagnose, treat, °or prevent such disease or condition, outweigh the known and potential risks of such product; °and that there are no adequate, approved, and available alternatives. ° All of these criteria must be met to allow for the product to be used in the treatment of patients °during the COVID-19 pandemic. The EUA for LAGEVRIO is in effect for the duration of the °COVID-19 declaration justifying emergency use of LAGEVRIO, unless terminated or revoked °(after which LAGEVRIO may no longer be used under the EUA). °For patent information:  www.msd.com/research/patent °Copyright © 2021-2022 [employer] [place], [place] and its affiliates. °All rights reserved. °[id number] °Revised: March 2022 ° °

## 2021-09-20 NOTE — Progress Notes (Signed)
Virtual Visit via Video Note  I connected with Candice [name]  on 09/20/21 at 11:40 AM EST by a video enabled telemedicine application and verified that I am speaking with the correct person using two identifiers.  Location patient: [place] Location provider:work or home office Persons participating in the virtual visit: patient, provider  I discussed the limitations and requested verbal permission for telemedicine visit. The patient expressed understanding and agreed to proceed.   HPI:  Acute telemedicine visit for Covid19: -Onset:about 2 days ago, tested positive yesterday -Symptoms include: cough, body aches, headache, nasal congestion, fevers a little, chills, vomited a few times, diarrhea a little -Denies:SOB, CP, inability to drink fluids and get up and down out of bed -eating some -Pertinent past medical history: see below, reports smoker -Pertinent medication allergies:No Known Allergies -COVID-19 vaccine status:  Immunization History  Administered Date(s) Administered   Influenza,inj,Quad PF,6+ Mos 06/22/2019   PFIZER(Purple Top)SARS-COV-2 Vaccination 12/01/2019, 12/27/2019   Tdap 10/14/2018    ROS: See pertinent positives and negatives per HPI.  Past Medical History:  Diagnosis Date   Allergy    seasonal   Anemia    Chlamydia    Eczema 04/29/2012   HSV (herpes simplex virus) infection    1 and 2    Tinea 04/29/2012    Past Surgical History:  Procedure Laterality Date   BREAST REDUCTION SURGERY     CESAREAN SECTION     Twice: 2001 & 2005   COLONOSCOPY     dental implants     POLYPECTOMY     WISDOM TOOTH EXTRACTION       Current Outpatient Medications:    AMBULATORY NON FORMULARY MEDICATION, Medication Name: [employer] Coconut-- dietary supplement- Take 1 scoop once every other day, Disp: , Rfl:    amLODipine (NORVASC) 5 MG tablet, Take 1 tablet (5 mg total) by mouth daily., Disp: 90 tablet, Rfl: 3   benzonatate (TESSALON PERLES) 100 MG capsule, 1-2 capsules  up to twice daily as needed for cough, Disp: 30 capsule, Rfl: 0   cholecalciferol (VITAMIN D3) 25 MCG (1000 UNIT) tablet, Take 1,000 Units by mouth daily., Disp: , Rfl:    gabapentin (NEURONTIN) 300 MG capsule, Take 1 capsule (300 mg total) by mouth at bedtime., Disp: 90 capsule, Rfl: 1   ibuprofen (ADVIL) 200 MG tablet, Take 200 mg by mouth every 6 (six) hours as needed., Disp: , Rfl:    molnupiravir EUA (LAGEVRIO) 200 mg CAPS capsule, Take 4 capsules (800 mg total) by mouth 2 (two) times daily for 5 days., Disp: 40 capsule, Rfl: 0   Multiple Vitamin (MULTIVITAMIN ADULT PO), Take by mouth., Disp: , Rfl:    omeprazole (PRILOSEC) 40 MG capsule, TAKE 1 CAPSULE(40 MG) BY MOUTH DAILY, Disp: 90 capsule, Rfl: 0   zolpidem (AMBIEN) 10 MG tablet, TAKE 1 TABLET(10 MG) BY MOUTH AT BEDTIME AS NEEDED FOR SLEEP, Disp: 30 tablet, Rfl: 0  EXAM:  VITALS per patient if applicable:  GENERAL: alert, oriented, appears well and in no acute distress  HEENT: atraumatic, conjunttiva clear, no obvious abnormalities on inspection of external nose and ears  NECK: normal movements of the head and neck  LUNGS: on inspection no signs of respiratory distress, breathing rate appears normal, no obvious gross SOB, gasping or wheezing  CV: no obvious cyanosis  MS: moves all visible extremities without noticeable abnormality  PSYCH/NEURO: pleasant and cooperative, no obvious depression or anxiety, speech and thought processing grossly intact  ASSESSMENT AND PLAN:  Discussed the following  assessment and plan:  COVID-19   Discussed treatment options and risk of drug interactions, ideal treatment window, potential complications, isolation and precautions for COVID-19.  She did want to do an antiviral. Did not wish to go get labs and has not had GFR check in over 1 year. After lengthy discussion, the patient opted for treatment with Legevrio.Discussed risks/interactions/side effects per EUA document vs possible benefits  and precautions. This information was shared with patient during the visit and also was provided in patient instructions. The patient did want a prescription for cough, Tessalon Rx sent.  Other symptomatic care measures summarized in patient instructions. Work/School slipped offered:  declined Advised to seek prompt virtual visit or in person care if worsening, new symptoms arise, or if is not improving with treatment as expected per our conversation of expected course. Discussed options for follow up care. Did let this patient know that I do telemedicine on Tuesdays and Thursdays for [place] and those are the days I am logged into the system. Advised to schedule follow up visit with PCP, [place] virtual visits or [facility] if any further questions or concerns to avoid delays in care.   I discussed the assessment and treatment plan with the patient. The patient was provided an opportunity to ask questions and all were answered. The patient agreed with the plan and demonstrated an understanding of the instructions.     Candice Kern, DO

## 2021-12-12 ENCOUNTER — Other Ambulatory Visit: Payer: Self-pay | Admitting: Family

## 2021-12-12 DIAGNOSIS — K219 Gastro-esophageal reflux disease without esophagitis: Secondary | ICD-10-CM

## 2021-12-17 ENCOUNTER — Ambulatory Visit: Payer: BC Managed Care – PPO | Admitting: Family Medicine

## 2021-12-17 ENCOUNTER — Telehealth: Payer: Self-pay | Admitting: Family Medicine

## 2021-12-17 NOTE — Telephone Encounter (Signed)
Pt missed her office visit with Dr. Gena Fray on 12/17/21. I sent a no show letter.  ?

## 2021-12-20 NOTE — Telephone Encounter (Signed)
1st no show, letter sent, fee waived KO ?

## 2022-04-18 ENCOUNTER — Encounter: Payer: Self-pay | Admitting: Gastroenterology

## 2022-07-21 ENCOUNTER — Other Ambulatory Visit: Payer: Self-pay | Admitting: Family Medicine

## 2022-07-21 DIAGNOSIS — I10 Essential (primary) hypertension: Secondary | ICD-10-CM

## 2022-11-22 ENCOUNTER — Other Ambulatory Visit: Payer: Self-pay | Admitting: Obstetrics and Gynecology

## 2023-01-06 ENCOUNTER — Encounter: Payer: Self-pay | Admitting: *Deleted
